# Patient Record
Sex: Male | Born: 1955 | Hispanic: No | Marital: Married | Smoking: Never smoker
Health system: Southern US, Community
[De-identification: ages and names within clinical notes are randomized; demographics above are authoritative.]

## PROBLEM LIST (undated history)

## (undated) DIAGNOSIS — I10 Essential (primary) hypertension: Secondary | ICD-10-CM

## (undated) HISTORY — PX: OTHER SURGICAL HISTORY: SHX169
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
## 2016-10-09 ENCOUNTER — Encounter (HOSPITAL_COMMUNITY): Payer: Self-pay | Admitting: Emergency Medicine

## 2016-10-09 ENCOUNTER — Emergency Department (HOSPITAL_COMMUNITY)
Admission: EM | Admit: 2016-10-09 | Discharge: 2016-10-09 | Disposition: A | Discharge status: Home / Self Care | Payer: BLUE CROSS/BLUE SHIELD | Attending: Emergency Medicine | Admitting: Emergency Medicine

## 2016-10-09 DIAGNOSIS — Y999 Unspecified external cause status: Secondary | ICD-10-CM | POA: Diagnosis not present

## 2016-10-09 DIAGNOSIS — Y939 Activity, unspecified: Secondary | ICD-10-CM | POA: Diagnosis not present

## 2016-10-09 DIAGNOSIS — S0101XA Laceration without foreign body of scalp, initial encounter: Secondary | ICD-10-CM | POA: Insufficient documentation

## 2016-10-09 DIAGNOSIS — W228XXA Striking against or struck by other objects, initial encounter: Secondary | ICD-10-CM | POA: Diagnosis not present

## 2016-10-09 DIAGNOSIS — Y929 Unspecified place or not applicable: Secondary | ICD-10-CM | POA: Diagnosis not present

## 2016-10-09 MED ORDER — LIDOCAINE-EPINEPHRINE-TETRACAINE (LET) SOLUTION
3.0000 mL | Freq: Once | NASAL | Status: DC
  Filled 2016-10-09: qty 3

## 2016-10-09 MED ORDER — LIDOCAINE-EPINEPHRINE (PF) 2 %-1:200000 IJ SOLN
INTRAMUSCULAR | Status: AC
  Administered 2016-10-09: 20 mL
  Filled 2016-10-09: qty 20

## 2016-10-09 MED ORDER — IBUPROFEN 800 MG PO TABS: 800.0000 mg | ORAL_TABLET | Freq: Three times a day (TID) | ORAL | 0 refills | Status: DC | PRN
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
## 2016-10-09 NOTE — Discharge Instructions (Signed)
Please follow up with your doctor in 5-7 days for staples removal.  Allow skin to dry for the first 12-16 hrs before washing.  Monitor wound daily for any sign of infection.  Take ibuprofen or tylenol at home as needed for pain.

## 2016-10-15 ENCOUNTER — Emergency Department (HOSPITAL_COMMUNITY)
Admission: EM | Admit: 2016-10-15 | Discharge: 2016-10-15 | Disposition: A | Discharge status: Home / Self Care | Payer: BLUE CROSS/BLUE SHIELD | Attending: Emergency Medicine | Admitting: Emergency Medicine

## 2016-10-15 ENCOUNTER — Encounter (HOSPITAL_COMMUNITY): Payer: Self-pay | Admitting: Emergency Medicine

## 2016-10-15 DIAGNOSIS — Z4802 Encounter for removal of sutures: Secondary | ICD-10-CM

## 2016-10-15 DIAGNOSIS — Z79899 Other long term (current) drug therapy: Secondary | ICD-10-CM | POA: Diagnosis not present

## 2016-10-15 MED ORDER — LIDOCAINE 2% (20 MG/ML) 5 ML SYRINGE
INTRAMUSCULAR | Status: AC
  Filled 2016-10-15: qty 15

## 2016-10-15 NOTE — ED Triage Notes (Signed)
Pt returning for staple removal from laceration on Friday. Pt has 3 staples on crown of head.

## 2016-10-15 NOTE — Discharge Instructions (Signed)
Keep wound clean with mild soap and water. Keep area covered from the sunlight in order to improve your scar formation. Ice and elevate for any pain and swelling relief. Alternate between Ibuprofen and Tylenol for additional pain relief. Follow up with your primary care doctor as needed for any future concerns regarding your wound. Monitor area for signs of infection to include, but not limited to: increasing pain, spreading redness, drainage/pus, worsening swelling, or fevers. Return to emergency department for emergent changing or worsening symptoms.

## 2016-10-15 NOTE — ED Provider Notes (Signed)
[facility]-EMERGENCY DEPT Provider Note   By signing my name below, I, Carlos Bradley, attest that this documentation has been prepared under the direction and in the presence of [address] [name], [employer]. Electronically Signed: Earmon Bradley, ED Scribe. 10/15/16. 12:10 PM.   History   Chief Complaint Chief Complaint  Patient presents with  . Suture / Staple Removal    The history is provided by the patient and medical records. No language interpreter was used.  Suture / Staple Removal  This is a new problem. The current episode started more than 2 days ago. The problem occurs rarely. The problem has been rapidly improving. Pertinent negatives include no chest pain, no abdominal pain and no shortness of breath. Nothing aggravates the symptoms. Nothing relieves the symptoms. Treatments tried: staples. The treatment provided significant relief.     Carlos Bradley is a 61 y.o. male who presents to the Emergency Department needing staples removed from the crown of his head that were placed six days ago. He has not taken anything for pain as he states he has not needed it. There are no modifying factors noted. He denies any issues and states the wound has healed well, and denies having pain, fever, chills, SOB, CP, abdominal pain, constipation, diarrhea, nausea, vomiting, dysuria, hematuria, body aches, redness, drainage, warmth or swelling of the area, numbness, tingling, focal weakness, or any other complaints at this time. Not on blood thinners.   History reviewed. No pertinent past medical history.  There are no active problems to display for this patient.   History reviewed. No pertinent surgical history.     Home Medications    Prior to Admission medications   Medication Sig Start Date End Date Taking? Authorizing Provider  ibuprofen (ADVIL,MOTRIN) 800 MG tablet Take 1 tablet (800 mg total) by mouth every 8 (eight) hours as needed for mild pain or moderate pain. 10/09/16   Fayrene Helper, PA-C    Family History No family history on file.  Social History Social History  Substance Use Topics  . Smoking status: Never Smoker  . Smokeless tobacco: Never Used  . Alcohol use Not on file     Allergies   Codeine   Review of Systems Review of Systems  Constitutional: Negative for chills and fever.  Respiratory: Negative for shortness of breath.   Cardiovascular: Negative for chest pain.  Gastrointestinal: Negative for abdominal pain, constipation, diarrhea, nausea and vomiting.  Genitourinary: Negative for dysuria and hematuria.  Musculoskeletal: Negative for arthralgias and myalgias.  Skin: Positive for wound. Negative for color change.  Allergic/Immunologic: Negative for immunocompromised state.  Neurological: Negative for weakness and numbness.  Hematological: Does not bruise/bleed easily.  Psychiatric/Behavioral: Negative for confusion.   All other systems reviewed and are negative for acute change except as noted in the HPI.   Physical Exam Updated Vital Signs BP 114/76   Pulse 63   Temp 98.1 F (36.7 C) (Oral)   Resp 16   Ht 5\' 11"  (1.803 m)   Wt 170 lb (77.1 kg)   SpO2 98%   BMI 23.71 kg/m   Physical Exam  Constitutional: He is oriented to person, place, and time. Vital signs are normal. He appears well-developed and well-nourished.  Non-toxic appearance. No distress.  Afebrile, nontoxic, NAD  HENT:  Head: Normocephalic.  Mouth/Throat: Mucous membranes are normal.  Eyes: Conjunctivae and EOM are normal. Right eye exhibits no discharge. Left eye exhibits no discharge.  Neck: Normal range of motion. Neck supple.  Cardiovascular: Normal rate and  intact distal pulses.   Pulmonary/Chest: Effort normal. No respiratory distress.  Abdominal: Normal appearance. He exhibits no distension.  Musculoskeletal: Normal range of motion.  Neurological: He is alert and oriented to person, place, and time. He has normal strength. No sensory deficit.    Skin: Skin is warm, dry and intact. No rash noted.  Well healed laceration to the crown of scalp with 3 staples in place, no erythema or warmth, no drainage, no swelling.  Psychiatric: He has a normal mood and affect.  Nursing note and vitals reviewed.    ED Treatments / Results  DIAGNOSTIC STUDIES: Oxygen Saturation is 98% on RA, normal by my interpretation.   COORDINATION OF CARE: 12:03 PM- Will remove sutures. Pt verbalizes understanding and agrees to plan.  Medications - No data to display  Labs (all labs ordered are listed, but only abnormal results are displayed) Labs Reviewed - No data to display  EKG  EKG Interpretation None       Radiology No results found.  Procedures .Suture Removal Date/Time: 10/15/2016 12:03 PM Performed by: Rhona RaiderSTREET, Kimyata Milich Authorized by: Rhona RaiderSTREET, Kelilah Hebard   Consent:    Consent obtained:  Verbal   Consent given by:  Patient   Risks discussed:  Pain   Alternatives discussed:  Referral Location:    Location:  Head/neck   Head/neck location:  Scalp Procedure details:    Wound appearance:  No signs of infection, good wound healing and clean   Number of staples removed:  3 Post-procedure details:    Post-removal:  No dressing applied   Patient tolerance of procedure:  Tolerated well, no immediate complications    (including critical care time)  Medications Ordered in ED Medications - No data to display   Initial Impression / Assessment and Plan / ED Course  I have reviewed the triage vital signs and the nursing notes.  Pertinent labs & imaging results that were available during my care of the patient were reviewed by me and considered in my medical decision making (see chart for details).     61 y.o. male here for staple removal. 3 staples removed from head, scar well healing and without any s/sx of infection or issues. Advised avoidance of sunlight to help with scarring. F/up with PCP as needed. I explained the diagnosis and  have given explicit precautions to return to the ER including for any other new or worsening symptoms. The patient understands and accepts the medical plan as it's been dictated and I have answered their questions. Discharge instructions concerning home care and prescriptions have been given. The patient is STABLE and is discharged to home in good condition.   I personally performed the services described in this documentation, which was scribed in my presence. The recorded information has been reviewed and is accurate.    Final Clinical Impressions(s) / ED Diagnoses   Final diagnoses:  Encounter for staple removal    New Prescriptions New Prescriptions   No medications on [address]       Kapono Luhn, ScottMercedes, [place] JerseyPA-C 10/15/16 1219    Charlynne PanderYao, David Hsienta, MD 10/15/16 [id number]

## 2017-07-25 ENCOUNTER — Encounter (HOSPITAL_COMMUNITY): Payer: Self-pay | Admitting: Emergency Medicine

## 2017-07-25 ENCOUNTER — Emergency Department (HOSPITAL_COMMUNITY)
Admission: EM | Admit: 2017-07-25 | Discharge: 2017-07-25 | Disposition: A | Discharge status: Home / Self Care | Payer: BLUE CROSS/BLUE SHIELD | Attending: Emergency Medicine | Admitting: Emergency Medicine

## 2017-07-25 ENCOUNTER — Emergency Department (HOSPITAL_COMMUNITY): Payer: BLUE CROSS/BLUE SHIELD

## 2017-07-25 DIAGNOSIS — R739 Hyperglycemia, unspecified: Secondary | ICD-10-CM | POA: Diagnosis not present

## 2017-07-25 DIAGNOSIS — I10 Essential (primary) hypertension: Secondary | ICD-10-CM | POA: Insufficient documentation

## 2017-07-25 DIAGNOSIS — R319 Hematuria, unspecified: Secondary | ICD-10-CM | POA: Insufficient documentation

## 2017-07-25 DIAGNOSIS — E860 Dehydration: Secondary | ICD-10-CM | POA: Insufficient documentation

## 2017-07-25 DIAGNOSIS — N200 Calculus of kidney: Secondary | ICD-10-CM | POA: Diagnosis not present

## 2017-07-25 DIAGNOSIS — R1032 Left lower quadrant pain: Secondary | ICD-10-CM | POA: Insufficient documentation

## 2017-07-25 DIAGNOSIS — R05 Cough: Secondary | ICD-10-CM | POA: Diagnosis not present

## 2017-07-25 DIAGNOSIS — B349 Viral infection, unspecified: Secondary | ICD-10-CM

## 2017-07-25 DIAGNOSIS — Z79899 Other long term (current) drug therapy: Secondary | ICD-10-CM | POA: Insufficient documentation

## 2017-07-25 DIAGNOSIS — R112 Nausea with vomiting, unspecified: Secondary | ICD-10-CM | POA: Diagnosis present

## 2017-07-25 DIAGNOSIS — N400 Enlarged prostate without lower urinary tract symptoms: Secondary | ICD-10-CM | POA: Diagnosis not present

## 2017-07-25 HISTORY — DX: Essential (primary) hypertension: I10

## 2017-07-25 LAB — CBC WITH DIFFERENTIAL/PLATELET
BASOS PCT: 0 %
Basophils Absolute: 0 10*3/uL (ref 0.0–0.1)
EOS ABS: 0 10*3/uL (ref 0.0–0.7)
Eosinophils Relative: 0 %
HEMATOCRIT: 40 % (ref 39.0–52.0)
Hemoglobin: 13.6 g/dL (ref 13.0–17.0)
LYMPHS ABS: 0.6 10*3/uL — AB (ref 0.7–4.0)
Lymphocytes Relative: 8 %
MCH: 30.4 pg (ref 26.0–34.0)
MCHC: 34 g/dL (ref 30.0–36.0)
MCV: 89.5 fL (ref 78.0–100.0)
MONOS PCT: 2 %
Monocytes Absolute: 0.2 10*3/uL (ref 0.1–1.0)
NEUTROS ABS: 7 10*3/uL (ref 1.7–7.7)
NEUTROS PCT: 90 %
Platelets: 245 10*3/uL (ref 150–400)
RBC: 4.47 MIL/uL (ref 4.22–5.81)
RDW: 12.6 % (ref 11.5–15.5)
WBC: 7.8 10*3/uL (ref 4.0–10.5)

## 2017-07-25 LAB — URINALYSIS, ROUTINE W REFLEX MICROSCOPIC
BILIRUBIN URINE: NEGATIVE
Glucose, UA: NEGATIVE mg/dL
KETONES UR: NEGATIVE mg/dL
Nitrite: NEGATIVE
PH: 5 (ref 5.0–8.0)
Protein, ur: NEGATIVE mg/dL
SPECIFIC GRAVITY, URINE: 1.013 (ref 1.005–1.030)
SQUAMOUS EPITHELIAL / LPF: NONE SEEN

## 2017-07-25 LAB — COMPREHENSIVE METABOLIC PANEL
ALBUMIN: 4.4 g/dL (ref 3.5–5.0)
ALT: 32 U/L (ref 17–63)
AST: 32 U/L (ref 15–41)
Alkaline Phosphatase: 56 U/L (ref 38–126)
Anion gap: 15 (ref 5–15)
BILIRUBIN TOTAL: 1 mg/dL (ref 0.3–1.2)
BUN: 26 mg/dL — AB (ref 6–20)
CO2: 25 mmol/L (ref 22–32)
CREATININE: 1.74 mg/dL — AB (ref 0.61–1.24)
Calcium: 9.8 mg/dL (ref 8.9–10.3)
Chloride: 102 mmol/L (ref 101–111)
GFR calc Af Amer: 47 mL/min — ABNORMAL LOW (ref >60)
GFR calc non Af Amer: 41 mL/min — ABNORMAL LOW (ref >60)
GLUCOSE: 148 mg/dL — AB (ref 65–99)
Potassium: 4 mmol/L (ref 3.5–5.1)
SODIUM: 142 mmol/L (ref 135–145)
TOTAL PROTEIN: 7.5 g/dL (ref 6.5–8.1)

## 2017-07-25 LAB — LIPASE, BLOOD: Lipase: 37 U/L (ref 11–51)

## 2017-07-25 MED ORDER — ONDANSETRON HCL 4 MG/2ML IJ SOLN
4.0000 mg | Freq: Once | INTRAMUSCULAR | Status: AC
  Administered 2017-07-25: 4 mg via INTRAVENOUS
  Filled 2017-07-25: qty 2

## 2017-07-25 MED ORDER — ONDANSETRON 4 MG PO TBDP: ORAL_TABLET | ORAL | 0 refills | Status: AC

## 2017-07-25 MED ORDER — SODIUM CHLORIDE 0.9 % IV BOLUS (SEPSIS)
1000.0000 mL | Freq: Once | INTRAVENOUS | Status: AC
  Administered 2017-07-25: 1000 mL via INTRAVENOUS

## 2017-07-25 NOTE — ED Triage Notes (Addendum)
Pt reports cough, nausea, vomiting 1 week.ago.  Sx subsided , then resumed NVD last night.  Last vomited clear liquid this am. Last meal of sandwich and soup, last night. Took Tylenol 100mg  last night muscle aches and Robitussin DM for cough. Pt is alert , oriented and ambulatory. Wife at bedside..  Pt denies shortness of breath, denies sore throat

## 2017-07-25 NOTE — ED Provider Notes (Signed)
[facility]-EMERGENCY DEPT Provider Note   CSN: [id number] Arrival date & time: 07/25/17  4782     History   Chief Complaint Chief Complaint  Patient presents with  . Nausea  . Emesis  . Cough  . Flank Pain    HPI Tahmir Kleckner is a 62 y.o. male.  Leemon Ayala is a 62 y.o. Male with a history of hypertension, presents to the ED for evaluation of cough, generalized body aches, nausea and vomiting.  Patient reports initially symptoms started 1 week ago when patient began coughing had some mild nausea and vomiting, rhinorrhea and low-grade fevers, the symptoms seem to be improving and have almost subsided by Thursday, but last night the patient began to experience nausea and vomiting once again, as well as some intermittent productive cough, patient reports subjective fevers and chills and generalized body aches.  Patient also reports pain particularly over the left flank which has been intermittent since last night.  Patient denies any focal abdominal pain, no hematemesis, diarrhea, melena or hematochezia.  Patient has been unable to keep down any food or fluids since last night and is now feeling generally weak.  He denies any dysuria, frequency or hematuria, no history of prior kidney stones.  Patient denies chest pain or shortness of breath.  No one at home with similar symptoms although patient did travel on airplanes this week.      Past Medical History:  Diagnosis Date  . Hypertension    tx due to  family hx    There are no active problems to display for this patient.   Past Surgical History:  Procedure Laterality Date  . wisdom teeth         Home Medications    Prior to Admission medications   Medication Sig Start Date End Date Taking? Authorizing Provider  acetaminophen (TYLENOL) 500 MG tablet Take 1,000 mg by mouth every 6 (six) hours as needed for headache.   Yes [provider]  atorvastatin (LIPITOR) 10 MG tablet Take 10  mg by mouth daily. 05/14/17  Yes [provider]  glucosamine-chondroitin 500-400 MG tablet Take 2 tablets by mouth daily.   Yes [provider]  hydrochlorothiazide (MICROZIDE) 12.5 MG capsule Take 12.5 mg by mouth daily. 07/22/17  Yes [provider]  ibuprofen (ADVIL,MOTRIN) 200 MG tablet Take 200 mg by mouth every 6 (six) hours as needed for moderate pain.   Yes [provider]  Misc Natural Products (BETA-SITOSTEROL PLANT STEROLS PO) Take 1 tablet by mouth 2 (two) times daily.   Yes [provider]  Multiple Vitamin (MULTIVITAMIN WITH MINERALS) TABS tablet Take 1 tablet by mouth daily.   Yes [provider]  naproxen sodium (ALEVE) 220 MG tablet Take 220 mg by mouth daily as needed (pain).   Yes [provider]  Omega-3 Fatty Acids (FISH OIL PO) Take 2 tablets by mouth daily.   Yes [provider]  Probiotic Product (ALIGN PO) Take 1 tablet by mouth daily.   Yes [provider]  Propylene Glycol (SYSTANE COMPLETE OP) Apply 2 drops to eye 2 (two) times daily.   Yes [provider]  vitamin C (ASCORBIC ACID) 500 MG tablet Take 500 mg by mouth daily.   Yes [provider]  ibuprofen (ADVIL,MOTRIN) 800 MG tablet Take 1 tablet (800 mg total) by mouth every 8 (eight) hours as needed for mild pain or moderate pain. Patient not taking: Reported on 07/25/2017 10/09/16   Fayrene Helper,  PA-C  ondansetron (ZOFRAN ODT) 4 MG disintegrating tablet 4mg  ODT q4 hours prn nausea/vomit 07/25/17   Dartha Lodge, PA-C    Family History Family History  Problem Relation Age of Onset  . Hypertension Mother   . Hypertension Father     Social History Social History   Tobacco Use  . Smoking status: Never Smoker  . Smokeless tobacco: Never Used  Substance Use Topics  . Alcohol use: No    Frequency: Never  . Drug use: Not on file     Allergies   Codeine; Tuna [fish allergy]; and Other   Review of  Systems Review of Systems  Constitutional: Positive for chills and fever.  HENT: Positive for congestion, rhinorrhea and sore throat.   Eyes: Negative for discharge, redness and itching.  Respiratory: Positive for cough. Negative for chest tightness, shortness of breath and wheezing.   Gastrointestinal: Positive for nausea and vomiting. Negative for abdominal pain, blood in stool and diarrhea.  Genitourinary: Positive for flank pain. Negative for dysuria and frequency.  Musculoskeletal: Positive for myalgias. Negative for arthralgias and back pain.  Skin: Negative for color change, pallor and rash.  Neurological: Positive for light-headedness. Negative for dizziness, weakness and headaches.     Physical Exam Updated Vital Signs BP (!) 155/83   Pulse 73   Temp 98.9 F (37.2 C) (Oral)   Resp 18   Wt 78 kg (172 lb)   SpO2 99%   BMI 23.99 kg/m   Physical Exam  Constitutional: He is oriented to person, place, and time. He appears well-developed and well-nourished. No distress.  Pt appears mildly ill, but is in no acute distress  HENT:  Head: Normocephalic and atraumatic.  TMs clear with good landmarks, minimal nasal mucosa edema with clear rhinorrhea, posterior oropharynx clear and moist, with some erythema, no edema or exudates, uvula midline   Eyes: Right eye exhibits no discharge. Left eye exhibits no discharge.  Cardiovascular: Normal rate, regular rhythm and normal heart sounds.  Pulmonary/Chest: Effort normal. No respiratory distress.  Respirations equal and unlabored, patient able to speak in full sentences, lungs clear to auscultation bilaterally  Abdominal: Soft. Bowel sounds are normal. He exhibits no distension and no mass. There is no tenderness. There is no guarding.  Abdomen soft, NTTP in all quadrants, no CVA tenderness.   Musculoskeletal: He exhibits no edema or deformity.  Neurological: He is alert and oriented to person, place, and time. Coordination normal.   Skin: Skin is warm and dry. Capillary refill takes less than 2 seconds. He is not diaphoretic.  Psychiatric: He has a normal mood and affect. His behavior is normal.  Nursing note and vitals reviewed.    ED Treatments / Results  Labs (all labs ordered are listed, but only abnormal results are displayed) Labs Reviewed  URINALYSIS, ROUTINE W REFLEX MICROSCOPIC - Abnormal; Notable for the following components:      Result Value   Hgb urine dipstick MODERATE (*)    Leukocytes, UA TRACE (*)    Bacteria, UA RARE (*)    All other components within normal limits  CBC WITH DIFFERENTIAL/PLATELET - Abnormal; Notable for the following components:   Lymphs Abs 0.6 (*)    All other components within normal limits  COMPREHENSIVE METABOLIC PANEL - Abnormal; Notable for the following components:   Glucose, Bld 148 (*)    BUN 26 (*)    Creatinine, Ser 1.74 (*)    GFR calc non Af Amer 41 (*)  GFR calc Af Amer 47 (*)    All other components within normal limits  LIPASE, BLOOD    EKG  EKG Interpretation None       Radiology Dg Chest 2 View  Result Date: 07/25/2017 CLINICAL DATA:  Pt c/o slightly productive cough, intermittent fever x 1 wk. EXAM: CHEST  2 VIEW COMPARISON:  None. FINDINGS: The heart size and mediastinal contours are within normal limits. Both lungs are clear. No pleural effusion or pneumothorax. The visualized skeletal structures are unremarkable. IMPRESSION: No active cardiopulmonary disease. Electronically Signed   By: Amie Portland M.D.   On: 07/25/2017 12:30   Ct Renal Stone Study  Result Date: 07/25/2017 CLINICAL DATA:  Pt reports cough, nausea, vomiting 1 week.ago. Sx subsided , then resumed NVD last night. Last vomited clear liquid this am. Last meal of sandwich and soup, last nightHematuria, no hx of stones EXAM: CT ABDOMEN AND PELVIS WITHOUT CONTRAST TECHNIQUE: Multidetector CT imaging of the abdomen and pelvis was performed following the standard protocol without IV  contrast. COMPARISON:  None. FINDINGS: Lower chest: Mild reticular opacities in the dependent lower lobes consistent with subsegmental atelectasis. Lung bases otherwise clear. Heart normal in size. Hepatobiliary: 7 mm low-density lesion in segment 7 consistent with a cyst. No other liver masses or lesions. Normal gallbladder. No bile duct dilation. Pancreas: Unremarkable. No pancreatic ductal dilatation or surrounding inflammatory changes. Spleen: Normal in size without focal abnormality. Adrenals/Urinary Tract: No adrenal masses. Mild bilateral hydronephrosis and hydroureter is. There are 3 small nonobstructing stones in the lower pole the left kidney. No other intrarenal stones. No renal masses. There are no ureteral stones. The bladder is distended. No bladder wall thickening. No bladder stones. Bladder is mildly elevated from an enlarged prostate. Stomach/Bowel: Stomach and small bowel unremarkable. There is increased stool in mild distention within the transverse and right colon. No colonic wall thickening or adjacent inflammation. Right colon extends across the midline to have the cecum lie in the left mid to upper abdomen. Normal appendix visualized. Vascular/Lymphatic: No significant vascular findings are present. No enlarged abdominal or pelvic lymph nodes. Reproductive: Prostate is enlarged measuring 6 x 5.2 x 5.2 cm. It bulges into the bladder base. Other: No abdominal wall hernia or abnormality. No abdominopelvic ascites. Musculoskeletal: No acute or significant osseous findings. IMPRESSION: 1. Mild bilateral hydronephrosis and hydroureters, without a ureteral stone. This appears due to bladder distention which is likely on the basis of bladder outlet obstruction due to an enlarged prostate. 2. Three small stones in the lower pole the left kidney. 3. Mild distention with increased stool noted in the right colon. No bowel inflammation or obstruction. Electronically Signed   By: Amie Portland M.D.   On:  07/25/2017 15:37    Procedures Procedures (including critical care time)  Medications Ordered in ED Medications  sodium chloride 0.9 % bolus 1,000 mL (0 mLs Intravenous Stopped 07/25/17 1337)  ondansetron (ZOFRAN) injection 4 mg (4 mg Intravenous Given 07/25/17 1211)  sodium chloride 0.9 % bolus 1,000 mL (0 mLs Intravenous Stopped 07/25/17 1605)     Initial Impression / Assessment and Plan / ED Course  I have reviewed the triage vital signs and the nursing notes.  Pertinent labs & imaging results that were available during my care of the patient were reviewed by me and considered in my medical decision making (see chart for details).  Patient presents for evaluation of nausea, vomiting, generalized aches and coughing, initial symptoms 1 week ago started to resolve  worsened last night unable to keep down any fluids.  Patient does report some left flank pain starting last night.  On arrival all vitals normal patient appears to be in no acute distress, does appear dehydrated on exam.  Lungs clear, abdomen benign.  Labs and chest x-ray and give patient 2 L fluid bolus and Zofran.  Chest x-ray without evidence of pneumonia or other acute cardiopulmonary disease.  Labs overall reassuring, no leukocytosis, normal hemoglobin, creatinine is 1.74 on review of medical record was 1.3 about a year ago, I feel this is likely prerenal and due to dehydration, BUN elevated.  Glucose is 148, patient was not fasting prior to arrival will have this rechecked with his primary care doctor.  No other acute electrolyte derangements, normal liver function.  Normal lipase.  UA without signs of infection, does show moderate amount of RBCs, given that patient has had some flank pain will get CT renal stone study to rule out although I have a low suspicion patient appears comfortable here in the emergency department.  On reevaluation at fluids patient reports he is feeling better, no further episodes of emesis since Zofran  and patient tolerating p.o.  CT scan shows mild bilateral hydronephrosis without obstructing kidney stones, felt that this is likely due to enlarged prostate, although patient has been urinating freely I do not feel that patient has acute urinary retention.  It does show 3 small stones in the left renal pole.  Will have patient follow-up with urology regarding enlarged prostate strict return precautions regarding urinary retention provided.  Will also have patient follow-up with his primary care provider for recheck of kidney function as well as glucose.  Encourage patient to drink plenty of fluids will discharge with Zofran suspect that most of patient's symptoms are due to viral etiology he has been traveling and has likely been exposed to multiple viruses in the past week.  At this time I feel that the patient is stable for discharge home, return precautions discussed.  Patient expresses understanding and is in agreement with plan.  Patient discussed with Dr. Adriana Simasook who saw and evaluated patient as well and is in agreement with.  Final Clinical Impressions(s) / ED Diagnoses   Final diagnoses:  Viral illness  Dehydration  Non-intractable vomiting with nausea, unspecified vomiting type  Enlarged prostate  Kidney stone on left side  Hyperglycemia    ED Discharge Orders        Ordered    ondansetron (ZOFRAN ODT) 4 MG disintegrating tablet     07/25/17 1547       Legrand RamsFord, Ikram Riebe N, PA-C 07/25/17 2049    Donnetta Hutchingook, Brian, MD 07/28/17 [id number]

## 2017-07-25 NOTE — Discharge Instructions (Signed)
Your evaluation today has been overall reassuring likely related to a viral illness.  Chest x-ray showed no evidence of pneumonia or overall reassuring.  Your kidney function was elevated today at 1.78, this was likely due to dehydration, continue to drink plenty of fluids and have this level rechecked within the next week by her primary care provider.  Your glucose was also slightly elevated today, I would like you to have this rechecked as well. You may use zofran as needed for nausea, tylenol for pain or fevers, please avoid NSAIDs until kidney function is rechecked.  CT scan shows an enlarged prostate which is causing some backup of urine towards your kidneys, it also shows 3 small nonobstructing stones in the left kidney.  You will need to follow-up with urology regarding prostate enlargement.  If any of the below scenarios develop please return to the ED for sooner reevaluation. Get help right away if: You have a fever or chills. You suddenly cannot urinate. You feel lightheaded, or very dizzy, or you faint. There are large amounts of blood or clots in the urine. Your urinary problems become hard to manage. You develop moderate to severe low back or flank pain. The flank is the side of your body between the ribs and the hip.

## 2017-07-27 ENCOUNTER — Encounter (HOSPITAL_COMMUNITY): Payer: Self-pay | Admitting: Emergency Medicine

## 2017-07-27 ENCOUNTER — Emergency Department (HOSPITAL_COMMUNITY)
Admission: EM | Admit: 2017-07-27 | Discharge: 2017-07-27 | Disposition: A | Discharge status: Home / Self Care | Payer: BLUE CROSS/BLUE SHIELD | Attending: Emergency Medicine | Admitting: Emergency Medicine

## 2017-07-27 ENCOUNTER — Emergency Department (HOSPITAL_COMMUNITY): Payer: BLUE CROSS/BLUE SHIELD

## 2017-07-27 DIAGNOSIS — R3914 Feeling of incomplete bladder emptying: Secondary | ICD-10-CM | POA: Diagnosis not present

## 2017-07-27 DIAGNOSIS — I10 Essential (primary) hypertension: Secondary | ICD-10-CM | POA: Insufficient documentation

## 2017-07-27 DIAGNOSIS — N401 Enlarged prostate with lower urinary tract symptoms: Secondary | ICD-10-CM | POA: Diagnosis not present

## 2017-07-27 DIAGNOSIS — R1032 Left lower quadrant pain: Secondary | ICD-10-CM | POA: Diagnosis present

## 2017-07-27 DIAGNOSIS — Z79899 Other long term (current) drug therapy: Secondary | ICD-10-CM | POA: Insufficient documentation

## 2017-07-27 DIAGNOSIS — R338 Other retention of urine: Secondary | ICD-10-CM | POA: Insufficient documentation

## 2017-07-27 DIAGNOSIS — N179 Acute kidney failure, unspecified: Secondary | ICD-10-CM | POA: Insufficient documentation

## 2017-07-27 LAB — SODIUM, URINE, RANDOM: Sodium, Ur: 35 mmol/L

## 2017-07-27 LAB — URINALYSIS, ROUTINE W REFLEX MICROSCOPIC
BILIRUBIN URINE: NEGATIVE
Glucose, UA: NEGATIVE mg/dL
HGB URINE DIPSTICK: NEGATIVE
Ketones, ur: NEGATIVE mg/dL
Leukocytes, UA: NEGATIVE
Nitrite: NEGATIVE
PH: 6 (ref 5.0–8.0)
Protein, ur: NEGATIVE mg/dL
SPECIFIC GRAVITY, URINE: 1.01 (ref 1.005–1.030)

## 2017-07-27 LAB — CBC
HCT: 36.2 % — ABNORMAL LOW (ref 39.0–52.0)
HEMOGLOBIN: 12.5 g/dL — AB (ref 13.0–17.0)
MCH: 30.3 pg (ref 26.0–34.0)
MCHC: 34.5 g/dL (ref 30.0–36.0)
MCV: 87.7 fL (ref 78.0–100.0)
Platelets: 236 10*3/uL (ref 150–400)
RBC: 4.13 MIL/uL — ABNORMAL LOW (ref 4.22–5.81)
RDW: 12.2 % (ref 11.5–15.5)
WBC: 14.4 10*3/uL — ABNORMAL HIGH (ref 4.0–10.5)

## 2017-07-27 LAB — COMPREHENSIVE METABOLIC PANEL
ALT: 43 U/L (ref 17–63)
ANION GAP: 14 (ref 5–15)
AST: 38 U/L (ref 15–41)
Albumin: 3.8 g/dL (ref 3.5–5.0)
Alkaline Phosphatase: 48 U/L (ref 38–126)
BILIRUBIN TOTAL: 1.2 mg/dL (ref 0.3–1.2)
BUN: 24 mg/dL — ABNORMAL HIGH (ref 6–20)
CALCIUM: 9.2 mg/dL (ref 8.9–10.3)
CO2: 26 mmol/L (ref 22–32)
Chloride: 94 mmol/L — ABNORMAL LOW (ref 101–111)
Creatinine, Ser: 2.34 mg/dL — ABNORMAL HIGH (ref 0.61–1.24)
GFR calc non Af Amer: 29 mL/min — ABNORMAL LOW (ref >60)
GFR, EST AFRICAN AMERICAN: 33 mL/min — AB (ref >60)
Glucose, Bld: 133 mg/dL — ABNORMAL HIGH (ref 65–99)
Potassium: 4 mmol/L (ref 3.5–5.1)
SODIUM: 134 mmol/L — AB (ref 135–145)
Total Protein: 6.8 g/dL (ref 6.5–8.1)

## 2017-07-27 LAB — CREATININE, URINE, RANDOM: CREATININE, URINE: 89.76 mg/dL

## 2017-07-27 MED ORDER — TAMSULOSIN HCL 0.4 MG PO CAPS: 0.4000 mg | ORAL_CAPSULE | Freq: Every day | ORAL | 0 refills | Status: DC

## 2017-07-27 MED ORDER — SODIUM CHLORIDE 0.9 % IV BOLUS (SEPSIS)
500.0000 mL | Freq: Once | INTRAVENOUS | Status: AC
  Administered 2017-07-27: 500 mL via INTRAVENOUS

## 2017-07-27 NOTE — ED Provider Notes (Addendum)
[facility]-EMERGENCY DEPT Provider Note   CSN: [id number] Arrival date & time: 07/27/17  0802     History   Chief Complaint Chief Complaint  Patient presents with  . Abdominal Pain  . Flank Pain    HPI Carlos Bradley is a 62 y.o. male.  HPI 62 year old comes in with chief complaint of left lower quadrant abdominal pain and left-sided flank pain.  Patient has history of hypertension, he was recently seen in the ED where he was noted to have prominent prostate, nephrolithiasis.  Patient does not have any known prostate issues and is not established with a urologist.  Patient states that since discharge his pain is moved towards the left lower quadrant and is more constant with waxing and waning intensity.  Patient denies any dysuria, but he does have urinary urgency, and also some dribbling. No known history of diverticulosis and patient denies any diarrhea, fevers, emesis, rectal pain.  Review of system is positive for nausea.  Past Medical History:  Diagnosis Date  . Hypertension    tx due to  family hx    There are no active problems to display for this patient.   Past Surgical History:  Procedure Laterality Date  . wisdom teeth         Home Medications    Prior to Admission medications   Medication Sig Start Date End Date Taking? Authorizing Provider  acetaminophen (TYLENOL) 500 MG tablet Take 1,000 mg by mouth every 6 (six) hours as needed for headache.   Yes [provider]  atorvastatin (LIPITOR) 10 MG tablet Take 10 mg by mouth daily. 05/14/17  Yes [provider]  glucosamine-chondroitin 500-400 MG tablet Take 2 tablets by mouth daily.   Yes [provider]  hydrochlorothiazide (MICROZIDE) 12.5 MG capsule Take 12.5 mg by mouth daily. 07/22/17  Yes [provider]  ibuprofen (ADVIL,MOTRIN) 200 MG tablet Take 200 mg by mouth every 6 (six) hours as needed for moderate pain.   Yes [provider]    Misc Natural Products (BETA-SITOSTEROL PLANT STEROLS PO) Take 1 tablet by mouth 2 (two) times daily.   Yes [provider]  Multiple Vitamin (MULTIVITAMIN WITH MINERALS) TABS tablet Take 1 tablet by mouth daily.   Yes [provider]  naproxen sodium (ALEVE) 220 MG tablet Take 220 mg by mouth daily as needed (pain).   Yes [provider]  Omega-3 Fatty Acids (FISH OIL PO) Take 2 tablets by mouth daily.   Yes [provider]  ondansetron (ZOFRAN ODT) 4 MG disintegrating tablet 4mg  ODT q4 hours prn nausea/vomit Patient taking differently: Take 4 mg by mouth every 4 (four) hours as needed for nausea or vomiting.  07/25/17  Yes Dartha Lodge, PA-C  Probiotic Product (ALIGN PO) Take 1 tablet by mouth daily.   Yes [provider]  Propylene Glycol (SYSTANE COMPLETE OP) Apply 2 drops to eye 2 (two) times daily.   Yes [provider]  vitamin C (ASCORBIC ACID) 500 MG tablet Take 500 mg by mouth daily.   Yes [provider]  ibuprofen (ADVIL,MOTRIN) 800 MG tablet Take 1 tablet (800 mg total) by mouth every 8 (eight) hours as needed for mild pain or moderate pain. Patient not taking: Reported on 07/25/2017 10/09/16   Fayrene Helper, PA-C  tamsulosin (FLOMAX) 0.4 MG CAPS capsule Take 1 capsule (0.4 mg total) by mouth daily. 07/27/17   Derwood Kaplan, MD    Family History Family History  Problem Relation  Age of Onset  . Hypertension Mother   . Hypertension Father     Social History Social History   Tobacco Use  . Smoking status: Never Smoker  . Smokeless tobacco: Never Used  Substance Use Topics  . Alcohol use: No    Frequency: Never  . Drug use: Not on file     Allergies   Azithromycin; Codeine; Other; and [place] allergy]   Review of Systems Review of Systems  Constitutional: Positive for activity change.  Gastrointestinal: Positive for abdominal pain.  Allergic/Immunologic: Negative for immunocompromised state.   Hematological: Does not bruise/bleed easily.  All other systems reviewed and are negative.    Physical Exam Updated Vital Signs BP 140/78 (BP Location: Right Arm)   Pulse 64   Temp 98 F (36.7 C) (Oral)   Resp 18   Ht 5\' 11"  (1.803 m)   Wt 78 kg (172 lb)   SpO2 97%   BMI 23.99 kg/m   Physical Exam  Constitutional: He is oriented to person, place, and time. He appears well-developed.  HENT:  Head: Normocephalic and atraumatic.  Eyes: Conjunctivae and EOM are normal. Pupils are equal, round, and reactive to light.  Neck: Normal range of motion. Neck supple.  Cardiovascular: Normal rate and regular rhythm.  Pulmonary/Chest: Effort normal and breath sounds normal.  Abdominal: Soft. Bowel sounds are normal. He exhibits no distension. There is tenderness in the left lower quadrant. There is guarding. There is no rebound.  Musculoskeletal: He exhibits no deformity.  Neurological: He is alert and oriented to person, place, and time.  Skin: Skin is warm.  Nursing note and vitals reviewed.    ED Treatments / Results  Labs (all labs ordered are listed, but only abnormal results are displayed) Labs Reviewed  COMPREHENSIVE METABOLIC PANEL - Abnormal; Notable for the following components:      Result Value   Sodium 134 (*)    Chloride 94 (*)    Glucose, Bld 133 (*)    BUN 24 (*)    Creatinine, Ser 2.34 (*)    GFR calc non Af Amer 29 (*)    GFR calc Af Amer 33 (*)    All other components within normal limits  CBC - Abnormal; Notable for the following components:   WBC 14.4 (*)    RBC 4.13 (*)    Hemoglobin 12.5 (*)    HCT 36.2 (*)    All other components within normal limits  URINALYSIS, ROUTINE W REFLEX MICROSCOPIC  SODIUM, URINE, RANDOM  CREATININE, URINE, RANDOM    EKG  EKG Interpretation None       Radiology US Renal  Result Date: 07/27/2017 CLINICAL DATA:  Renal failure EXAM: RENAL / URINARY TRACT ULTRASOUND COMPLETE COMPARISON:  CT abdomen and pelvis  July 25, 2017 FINDINGS: Right Kidney: Length: 11.3 cm. Echogenicity is increased. Renal cortical thickness is normal. No mass perinephric fluid visualized. There is mild hydronephrosis seen after patient voids. There is proximal ureterectasis. No calculus evident. Left Kidney: Length: 11.9 cm. Echogenicity is increased. Renal cortical thickness is normal. No mass visualized. There is moderate perinephric fluid on the left with slight fullness of the left renal collecting system. No sonographically demonstrable calculus or ureterectasis. Bladder: Urinary bladder remains distended after patient attempts to void. There is wall thickening in portions of the urinary bladder. Postvoid residual is measured at 561 cubic cm. Prostate is prominent measuring 4.3 x 3.3 x 3.1 cm. Prostate impresses upon the inferior urinary bladder. IMPRESSION: 1. Large  postvoid residual suggesting bladder outlet obstruction. Mild wall thickening in the urinary bladder suggests a degree of cystitis. 2. Mild hydronephrosis on the right with minimal hydronephrosis on the left. Suspect stasis phenomenon due to persistence of urinary bladder distention. No calculi evident ultrasound. 3. Moderate perinephric fluid on the left is noted. Question calyceal rupture due to chronic hydronephrosis on the left causing this perinephric fluid. 4. Increase in renal echogenicity bilaterally, a finding felt to be indicative of chronic medical renal disease. Renal cortical thickness bilaterally normal. 5. Prominent prostate which impresses upon the inferior aspect of the urinary bladder. Electronically Signed   By: Bretta BangWilliam  Woodruff III M.D.   On: 07/27/2017 12:31    Procedures Procedures (including critical care time)  Medications Ordered in ED Medications  sodium chloride 0.9 % bolus 500 mL (500 mLs Intravenous New Bag/Given 07/27/17 1540)     Initial Impression / Assessment and Plan / ED Course  I have reviewed the triage vital signs and the  nursing notes.  Pertinent labs & imaging results that were available during my care of the patient were reviewed by me and considered in my medical decision making (see chart for details).  Clinical Course as of Jul 27 1544  Tue Jul 27, 2017  1516 Dr. Berneice HeinrichManny, Urology recommends f.u in 1 week and starting tamsulosin. 500 cc bolus ordered, FENA < 1. Creatinine: (!) 2.34 [AN]  1516 Results from the ER workup discussed with the patient face to face and all questions answered to the best of my ability.   [AN]    Clinical Course User Index [AN] Derwood KaplanNanavati, Angelgabriel Willmore, MD   [age] year old with chief complaint of left lower quadrant abdominal pain and nausea.  Patient was recently diagnosed with bilateral hydronephrosis, and pronounced prostate.  Patient has not seen urologist.  He returns with worsening of his symptoms.  Patient does not have distended abdomen on the exam, and he has focal left lower quadrant tenderness.  I spoke with the radiologist, and they did not see any evidence of diverticulosis on the CT renal stone that was completed just 2 days ago.  In addition to diverticulitis, differential diagnosis includes ureteral colic, and acute urinary retention.  Patient's creatinine is elevated.  Bladder scan revealed more than 500 cc of urine, and patient's ultrasound renal is showing persistent bilateral hydronephrosis.  It appears clinically that foot patient is having postobstructive uropathy, secondary to BPH.  Foley catheter placed and we had more than thousand cc of urine output.  Patient feels a lot comfortable on reassessment.   We will call urology and discussed the disposition.  Final Clinical Impressions(s) / ED Diagnoses   Final diagnoses:  AKI (acute kidney injury) (HCC)  Acute urinary retention  Benign prostatic hyperplasia with incomplete bladder emptying    ED Discharge Orders        Ordered    tamsulosin (FLOMAX) 0.4 MG CAPS capsule  Daily     07/27/17 1533       Derwood KaplanNanavati,  Nichele Slawson, MD 07/27/17 1455    Derwood KaplanNanavati, Neiva Maenza, MD 07/27/17 1517    Derwood KaplanNanavati, Ottie Tillery, MD 07/27/17 1546

## 2017-07-27 NOTE — Discharge Instructions (Addendum)
He was seen in the ER for abdominal pain, and we noted that you have significant urinary retention. We suspect that the underlying cause for your pain is urinary retention and sending you home with a Foley catheter. Please read the instructions provided on the Foley catheter.  We spoke with the urologist and they would like you to be seen in 1 week.  Ensure that you drink plenty of fluid and start taking the medications prescribed.

## 2017-07-27 NOTE — ED Triage Notes (Signed)
Patient here from home with complaints of right side abdominal pain radiating around to back. Reports being here on 2/24 and diagnosed with 3 stones. Increased pain last night, mild today. Denies n/v/d.

## 2017-08-30 ENCOUNTER — Other Ambulatory Visit: Payer: Self-pay | Admitting: Urology

## 2017-08-31 ENCOUNTER — Other Ambulatory Visit: Payer: Self-pay | Admitting: Urology

## 2017-09-01 MED ORDER — MAGNESIUM CITRATE PO SOLN: 1.0000 | Freq: Once | ORAL | Status: AC

## 2017-09-03 NOTE — Patient Instructions (Addendum)
Carlos DoveSteven Bradley  09/03/2017   Your procedure is scheduled on: 09-08-17   Report [facility]  Entrance Report to Admitting at 10:00 AM    Call this number if you have problems the morning of surgery [phone]   Remember: Do not eat food or drink liquids :After Midnight.     Take these medicines the morning of surgery with A SIP OF WATER: None. You may bring and use your eyedrops as needed.                               You may not have any metal on your body including hair pins and              piercings  Do not wear jewelry, lotions, powders or deodorant             Men may shave face and neck.   Do not bring valuables to the hospital. [place] IS NOT             RESPONSIBLE   FOR VALUABLES.  Contacts, dentures or bridgework may not be worn into surgery.  Leave suitcase in the car. After surgery it may be brought to your room.                Please read over the following fact sheets you were given: _____________________________________________________________________          [facility] Before surgery, you can play an important role.  Because skin is not sterile, your skin needs to be as free of germs as possible.  You can reduce the number of germs on your skin by washing with CHG (chlorahexidine gluconate) soap before surgery.  CHG is an antiseptic cleaner which kills germs and bonds with the skin to continue killing germs even after washing. Please DO NOT use if you have an allergy to CHG or antibacterial soaps.  If your skin becomes reddened/irritated stop using the CHG and inform your nurse when you arrive at Short Stay. Do not shave (including legs and underarms) for at least 48 hours prior to the first CHG shower.  You may shave your face/neck. Please follow these instructions carefully:  1.  Shower with CHG Soap the night before surgery and the  morning of Surgery.  2.  If you choose to wash your hair, wash your  hair first as usual with your  normal  shampoo.  3.  After you shampoo, rinse your hair and body thoroughly to remove the  shampoo.                           4.  Use CHG as you would any other liquid soap.  You can apply chg directly  to the skin and wash                       Gently with a scrungie or clean washcloth.  5.  Apply the CHG Soap to your body ONLY FROM THE NECK DOWN.   Do not use on face/ open                           Wound or open sores. Avoid contact with eyes, ears mouth and  genitals (private parts).                       Wash face,  Genitals (private parts) with your normal soap.             6.  Wash thoroughly, paying special attention to the area where your surgery  will be performed.  7.  Thoroughly rinse your body with warm water from the neck down.  8.  DO NOT shower/wash with your normal soap after using and rinsing off  the CHG Soap.                9.  Pat yourself dry with a clean towel.            10.  Wear clean pajamas.            11.  Place clean sheets on your bed the night of your first shower and do not  sleep with pets. Day of Surgery : Do not apply any lotions/deodorants the morning of surgery.  Please wear clean clothes to the hospital/surgery center.  FAILURE TO FOLLOW THESE INSTRUCTIONS MAY RESULT IN THE CANCELLATION OF YOUR SURGERY PATIENT SIGNATURE_________________________________  NURSE SIGNATURE__________________________________  ________________________________________________________________________  WHAT IS A BLOOD TRANSFUSION? Blood Transfusion Information  A transfusion is the replacement of blood or some of its parts. Blood is made up of multiple cells which provide different functions.  Red blood cells carry oxygen and are used for blood loss replacement.  White blood cells fight against infection.  Platelets control bleeding.  Plasma helps clot blood.  Other blood products are available for specialized needs, such as hemophilia or  other clotting disorders. BEFORE THE TRANSFUSION  Who gives blood for transfusions?   Healthy volunteers who are fully evaluated to make sure their blood is safe. This is blood bank blood. Transfusion therapy is the safest it has ever been in the practice of medicine. Before blood is taken from a donor, a complete history is taken to make sure that person has no history of diseases nor engages in risky social behavior (examples are intravenous drug use or sexual activity with multiple partners). The donor's travel history is screened to minimize risk of transmitting infections, such as malaria. The donated blood is tested for signs of infectious diseases, such as HIV and hepatitis. The blood is then tested to be sure it is compatible with you in order to minimize the chance of a transfusion reaction. If you or a relative donates blood, this is often done in anticipation of surgery and is not appropriate for emergency situations. It takes many days to process the donated blood. RISKS AND COMPLICATIONS Although transfusion therapy is very safe and saves many lives, the main dangers of transfusion include:   Getting an infectious disease.  Developing a transfusion reaction. This is an allergic reaction to something in the blood you were given. Every precaution is taken to prevent this. The decision to have a blood transfusion has been considered carefully by your caregiver before blood is given. Blood is not given unless the benefits outweigh the risks. AFTER THE TRANSFUSION  Right after receiving a blood transfusion, you will usually feel much better and more energetic. This is especially true if your red blood cells have gotten low (anemic). The transfusion raises the level of the red blood cells which carry oxygen, and this usually causes an energy increase.  The nurse administering the transfusion will monitor you carefully for complications. HOME  CARE INSTRUCTIONS  No special instructions are  needed after a transfusion. You may find your energy is better. Speak with your caregiver about any limitations on activity for underlying diseases you may have. SEEK MEDICAL CARE IF:   Your condition is not improving after your transfusion.  You develop redness or irritation at the intravenous (IV) site. SEEK IMMEDIATE MEDICAL CARE IF:  Any of the following symptoms occur over the next 12 hours:  Shaking chills.  You have a temperature by mouth above 102 F (38.9 C), not controlled by medicine.  Chest, back, or muscle pain.  People around you feel you are not acting correctly or are confused.  Shortness of breath or difficulty breathing.  Dizziness and fainting.  You get a rash or develop hives.  You have a decrease in urine output.  Your urine turns a dark color or changes to pink, red, or brown. Any of the following symptoms occur over the next 10 days:  You have a temperature by mouth above 102 F (38.9 C), not controlled by medicine.  Shortness of breath.  Weakness after normal activity.  The white part of the eye turns yellow (jaundice).  You have a decrease in the amount of urine or are urinating less often.  Your urine turns a dark color or changes to pink, red, or brown. Document Released: 05/15/2000 Document Revised: 08/10/2011 Document Reviewed: 01/02/2008 [facility] Patient Information 2014 [place], [place].  _______________________________________________________________________

## 2017-09-06 ENCOUNTER — Encounter (HOSPITAL_COMMUNITY)
Admission: RE | Admit: 2017-09-06 | Discharge: 2017-09-06 | Disposition: A | Discharge status: Home / Self Care | Payer: BLUE CROSS/BLUE SHIELD | Source: Ambulatory Visit | Attending: Urology | Admitting: Urology

## 2017-09-06 ENCOUNTER — Other Ambulatory Visit: Payer: Self-pay

## 2017-09-06 ENCOUNTER — Encounter (HOSPITAL_COMMUNITY): Payer: Self-pay

## 2017-09-06 LAB — BASIC METABOLIC PANEL
Anion gap: 11 (ref 5–15)
BUN: 24 mg/dL — AB (ref 6–20)
CALCIUM: 9.3 mg/dL (ref 8.9–10.3)
CO2: 25 mmol/L (ref 22–32)
CREATININE: 1.18 mg/dL (ref 0.61–1.24)
Chloride: 102 mmol/L (ref 101–111)
GFR calc Af Amer: >60 mL/min (ref >60)
GFR calc non Af Amer: >60 mL/min (ref >60)
Glucose, Bld: 84 mg/dL (ref 65–99)
Potassium: 4.2 mmol/L (ref 3.5–5.1)
SODIUM: 138 mmol/L (ref 135–145)

## 2017-09-06 LAB — ABO/RH: ABO/RH(D): A NEG

## 2017-09-06 LAB — CBC
HCT: 39.9 % (ref 39.0–52.0)
Hemoglobin: 13.2 g/dL (ref 13.0–17.0)
MCH: 29.7 pg (ref 26.0–34.0)
MCHC: 33.1 g/dL (ref 30.0–36.0)
MCV: 89.7 fL (ref 78.0–100.0)
PLATELETS: 341 10*3/uL (ref 150–400)
RBC: 4.45 MIL/uL (ref 4.22–5.81)
RDW: 12.7 % (ref 11.5–15.5)
WBC: 7.1 10*3/uL (ref 4.0–10.5)

## 2017-09-06 NOTE — Progress Notes (Addendum)
07-25-17 (Epic) CXR  10-08-16 (EKG) On chart. Note indicating that EKG is NSR is in Chart Everywhere  10-08-16 CXR on chart

## 2017-09-06 NOTE — Progress Notes (Signed)
09-06-17 BMP result routed to Dr. Berneice HeinrichManny for review.

## 2017-09-07 MED ORDER — GENTAMICIN SULFATE 40 MG/ML IJ SOLN
5.0000 mg/kg | INTRAVENOUS | Status: AC
  Administered 2017-09-08: 400 mg via INTRAVENOUS
  Filled 2017-09-07 (×2): qty 10

## 2017-09-08 ENCOUNTER — Inpatient Hospital Stay (HOSPITAL_COMMUNITY): Payer: BLUE CROSS/BLUE SHIELD | Admitting: Certified Registered Nurse Anesthetist

## 2017-09-08 ENCOUNTER — Encounter (HOSPITAL_COMMUNITY)
Admission: RE | Disposition: A | Discharge status: Home / Self Care | Payer: Self-pay | Source: Ambulatory Visit | Attending: Urology

## 2017-09-08 ENCOUNTER — Other Ambulatory Visit: Payer: Self-pay

## 2017-09-08 ENCOUNTER — Inpatient Hospital Stay (HOSPITAL_COMMUNITY)
Admission: RE | Admit: 2017-09-08 | Discharge: 2017-09-10 | DRG: 707 | Disposition: A | Discharge status: Home / Self Care | Payer: BLUE CROSS/BLUE SHIELD | Source: Ambulatory Visit | Attending: Urology | Admitting: Urology

## 2017-09-08 ENCOUNTER — Encounter (HOSPITAL_COMMUNITY): Payer: Self-pay

## 2017-09-08 DIAGNOSIS — E785 Hyperlipidemia, unspecified: Secondary | ICD-10-CM | POA: Diagnosis present

## 2017-09-08 DIAGNOSIS — I10 Essential (primary) hypertension: Secondary | ICD-10-CM | POA: Diagnosis present

## 2017-09-08 DIAGNOSIS — R338 Other retention of urine: Secondary | ICD-10-CM | POA: Diagnosis present

## 2017-09-08 DIAGNOSIS — N179 Acute kidney failure, unspecified: Secondary | ICD-10-CM | POA: Diagnosis present

## 2017-09-08 DIAGNOSIS — N401 Enlarged prostate with lower urinary tract symptoms: Secondary | ICD-10-CM | POA: Diagnosis present

## 2017-09-08 DIAGNOSIS — K4020 Bilateral inguinal hernia, without obstruction or gangrene, not specified as recurrent: Secondary | ICD-10-CM | POA: Diagnosis present

## 2017-09-08 DIAGNOSIS — N138 Other obstructive and reflux uropathy: Secondary | ICD-10-CM | POA: Diagnosis present

## 2017-09-08 DIAGNOSIS — R55 Syncope and collapse: Secondary | ICD-10-CM | POA: Diagnosis present

## 2017-09-08 HISTORY — PX: XI ROBOTIC ASSISTED SIMPLE PROSTATECTOMY: SHX6713

## 2017-09-08 LAB — GLUCOSE, CAPILLARY: GLUCOSE-CAPILLARY: 245 mg/dL — AB (ref 65–99)

## 2017-09-08 LAB — HEMOGLOBIN AND HEMATOCRIT, BLOOD
HEMATOCRIT: 36.5 % — AB (ref 39.0–52.0)
Hemoglobin: 12.5 g/dL — ABNORMAL LOW (ref 13.0–17.0)

## 2017-09-08 LAB — TYPE AND SCREEN
ABO/RH(D): A NEG
ANTIBODY SCREEN: NEGATIVE

## 2017-09-08 SURGERY — PROSTATECTOMY, SIMPLE, ROBOT-ASSISTED
Anesthesia: General

## 2017-09-08 MED ORDER — ATORVASTATIN CALCIUM 10 MG PO TABS
10.0000 mg | ORAL_TABLET | Freq: Every day | ORAL | Status: DC
  Administered 2017-09-08 – 2017-09-10 (×3): 10 mg via ORAL
  Filled 2017-09-08 (×3): qty 1

## 2017-09-08 MED ORDER — SODIUM CHLORIDE 0.9 % IV BOLUS: 1000.0000 mL | Freq: Once | INTRAVENOUS | Status: DC

## 2017-09-08 MED ORDER — LACTATED RINGERS IR SOLN
Status: DC | PRN
  Administered 2017-09-08: 1000 mL

## 2017-09-08 MED ORDER — OXYCODONE HCL 5 MG PO TABS
5.0000 mg | ORAL_TABLET | ORAL | Status: DC | PRN
  Administered 2017-09-08: 5 mg via ORAL
  Filled 2017-09-08: qty 1

## 2017-09-08 MED ORDER — HYDROMORPHONE HCL 1 MG/ML IJ SOLN
0.2500 mg | INTRAMUSCULAR | Status: DC | PRN
  Administered 2017-09-08 (×2): 0.5 mg via INTRAVENOUS

## 2017-09-08 MED ORDER — CEFAZOLIN SODIUM-DEXTROSE 2-4 GM/100ML-% IV SOLN
INTRAVENOUS | Status: AC
  Filled 2017-09-08: qty 100

## 2017-09-08 MED ORDER — CEFAZOLIN SODIUM-DEXTROSE 2-3 GM-%(50ML) IV SOLR
INTRAVENOUS | Status: DC | PRN
  Administered 2017-09-08: 2 g via INTRAVENOUS

## 2017-09-08 MED ORDER — HYDROCHLOROTHIAZIDE 12.5 MG PO CAPS
12.5000 mg | ORAL_CAPSULE | Freq: Every day | ORAL | Status: DC
  Administered 2017-09-09 – 2017-09-10 (×2): 12.5 mg via ORAL
  Filled 2017-09-08 (×2): qty 1

## 2017-09-08 MED ORDER — LACTATED RINGERS IV SOLN
INTRAVENOUS | Status: DC
  Administered 2017-09-08 (×3): via INTRAVENOUS

## 2017-09-08 MED ORDER — ONDANSETRON HCL 4 MG/2ML IJ SOLN
INTRAMUSCULAR | Status: AC
  Filled 2017-09-08: qty 2

## 2017-09-08 MED ORDER — MIDAZOLAM HCL 5 MG/5ML IJ SOLN
INTRAMUSCULAR | Status: DC | PRN
  Administered 2017-09-08: 2 mg via INTRAVENOUS

## 2017-09-08 MED ORDER — PROPOFOL 10 MG/ML IV BOLUS
INTRAVENOUS | Status: AC
  Filled 2017-09-08: qty 20

## 2017-09-08 MED ORDER — MIDAZOLAM HCL 2 MG/2ML IJ SOLN
INTRAMUSCULAR | Status: AC
  Filled 2017-09-08: qty 2

## 2017-09-08 MED ORDER — SUGAMMADEX SODIUM 200 MG/2ML IV SOLN
INTRAVENOUS | Status: DC | PRN
  Administered 2017-09-08: 200 mg via INTRAVENOUS

## 2017-09-08 MED ORDER — LIDOCAINE 2% (20 MG/ML) 5 ML SYRINGE
INTRAMUSCULAR | Status: DC | PRN
  Administered 2017-09-08: 75 mg via INTRAVENOUS

## 2017-09-08 MED ORDER — HYDROMORPHONE HCL 1 MG/ML IJ SOLN: 0.5000 mg | INTRAMUSCULAR | Status: DC | PRN

## 2017-09-08 MED ORDER — SODIUM CHLORIDE 0.9 % IJ SOLN
INTRAMUSCULAR | Status: DC | PRN
  Administered 2017-09-08: 20 mL

## 2017-09-08 MED ORDER — KETOROLAC TROMETHAMINE 30 MG/ML IJ SOLN: 30.0000 mg | Freq: Once | INTRAMUSCULAR | Status: DC | PRN

## 2017-09-08 MED ORDER — PROMETHAZINE HCL 25 MG/ML IJ SOLN: 6.2500 mg | INTRAMUSCULAR | Status: DC | PRN

## 2017-09-08 MED ORDER — ONDANSETRON HCL 4 MG/2ML IJ SOLN
INTRAMUSCULAR | Status: DC | PRN
  Administered 2017-09-08: 4 mg via INTRAVENOUS

## 2017-09-08 MED ORDER — SULFAMETHOXAZOLE-TRIMETHOPRIM 800-160 MG PO TABS: 1.0000 | ORAL_TABLET | Freq: Two times a day (BID) | ORAL | 0 refills | Status: AC

## 2017-09-08 MED ORDER — ROCURONIUM BROMIDE 10 MG/ML (PF) SYRINGE
PREFILLED_SYRINGE | INTRAVENOUS | Status: DC | PRN
  Administered 2017-09-08: 10 mg via INTRAVENOUS
  Administered 2017-09-08: 60 mg via INTRAVENOUS
  Administered 2017-09-08: 20 mg via INTRAVENOUS
  Administered 2017-09-08: 10 mg via INTRAVENOUS

## 2017-09-08 MED ORDER — DEXAMETHASONE SODIUM PHOSPHATE 10 MG/ML IJ SOLN
INTRAMUSCULAR | Status: DC | PRN
  Administered 2017-09-08: 5 mg via INTRAVENOUS

## 2017-09-08 MED ORDER — HYDROCODONE-ACETAMINOPHEN 5-325 MG PO TABS: 1.0000 | ORAL_TABLET | Freq: Four times a day (QID) | ORAL | 0 refills | Status: AC | PRN

## 2017-09-08 MED ORDER — ONDANSETRON HCL 4 MG/2ML IJ SOLN
4.0000 mg | INTRAMUSCULAR | Status: DC | PRN
  Administered 2017-09-08: 4 mg via INTRAVENOUS
  Filled 2017-09-08: qty 2

## 2017-09-08 MED ORDER — DIPHENHYDRAMINE HCL 50 MG/ML IJ SOLN: 12.5000 mg | Freq: Four times a day (QID) | INTRAMUSCULAR | Status: DC | PRN

## 2017-09-08 MED ORDER — ACETAMINOPHEN 500 MG PO TABS
1000.0000 mg | ORAL_TABLET | Freq: Four times a day (QID) | ORAL | Status: AC
  Administered 2017-09-08 – 2017-09-09 (×4): 1000 mg via ORAL
  Filled 2017-09-08 (×4): qty 2

## 2017-09-08 MED ORDER — EPHEDRINE SULFATE-NACL 50-0.9 MG/10ML-% IV SOSY
PREFILLED_SYRINGE | INTRAVENOUS | Status: DC | PRN
  Administered 2017-09-08: 10 mg via INTRAVENOUS
  Administered 2017-09-08: 5 mg via INTRAVENOUS

## 2017-09-08 MED ORDER — FENTANYL CITRATE (PF) 250 MCG/5ML IJ SOLN
INTRAMUSCULAR | Status: DC | PRN
  Administered 2017-09-08: 100 ug via INTRAVENOUS
  Administered 2017-09-08 (×2): 50 ug via INTRAVENOUS

## 2017-09-08 MED ORDER — DIPHENHYDRAMINE HCL 12.5 MG/5ML PO ELIX: 12.5000 mg | ORAL_SOLUTION | Freq: Four times a day (QID) | ORAL | Status: DC | PRN

## 2017-09-08 MED ORDER — FENTANYL CITRATE (PF) 250 MCG/5ML IJ SOLN
INTRAMUSCULAR | Status: AC
  Filled 2017-09-08: qty 5

## 2017-09-08 MED ORDER — PROPOFOL 10 MG/ML IV BOLUS
INTRAVENOUS | Status: DC | PRN
  Administered 2017-09-08: 170 mg via INTRAVENOUS

## 2017-09-08 MED ORDER — SODIUM CHLORIDE 0.9 % IJ SOLN
INTRAMUSCULAR | Status: AC
  Filled 2017-09-08: qty 20

## 2017-09-08 MED ORDER — ROCURONIUM BROMIDE 10 MG/ML (PF) SYRINGE
PREFILLED_SYRINGE | INTRAVENOUS | Status: AC
  Filled 2017-09-08: qty 5

## 2017-09-08 MED ORDER — BUPIVACAINE LIPOSOME 1.3 % IJ SUSP
20.0000 mL | Freq: Once | INTRAMUSCULAR | Status: DC
  Filled 2017-09-08: qty 20

## 2017-09-08 MED ORDER — DEXTROSE-NACL 5-0.45 % IV SOLN
INTRAVENOUS | Status: DC
  Administered 2017-09-08 – 2017-09-09 (×2): via INTRAVENOUS

## 2017-09-08 MED ORDER — BUPIVACAINE LIPOSOME 1.3 % IJ SUSP
INTRAMUSCULAR | Status: DC | PRN
  Administered 2017-09-08: 20 mL

## 2017-09-08 MED ORDER — HYDROMORPHONE HCL 1 MG/ML IJ SOLN
INTRAMUSCULAR | Status: AC
  Filled 2017-09-08: qty 1

## 2017-09-08 SURGICAL SUPPLY — 62 items
APPLICATOR COTTON TIP 6IN STRL (MISCELLANEOUS) ×3 IMPLANT
CATH FOLEY 2WAY SLVR 18FR 30CC (CATHETERS) ×3 IMPLANT
CATH FOLEY 3WAY 30CC 22FR (CATHETERS) ×3 IMPLANT
CATH TIEMANN FOLEY 18FR 5CC (CATHETERS) IMPLANT
CHLORAPREP W/TINT 26ML (MISCELLANEOUS) ×3 IMPLANT
CLIP VESOLOCK LG 6/CT PURPLE (CLIP) IMPLANT
CLOTH BEACON ORANGE TIMEOUT ST (SAFETY) ×3 IMPLANT
COVER SURGICAL LIGHT HANDLE (MISCELLANEOUS) ×3 IMPLANT
COVER TIP SHEARS 8 DVNC (MISCELLANEOUS) ×1 IMPLANT
COVER TIP SHEARS 8MM DA VINCI (MISCELLANEOUS) ×2
CUTTER ECHEON FLEX ENDO 45 340 (ENDOMECHANICALS) IMPLANT
DECANTER SPIKE VIAL GLASS SM (MISCELLANEOUS) IMPLANT
DERMABOND ADVANCED (GAUZE/BANDAGES/DRESSINGS) ×2
DERMABOND ADVANCED .7 DNX12 (GAUZE/BANDAGES/DRESSINGS) ×1 IMPLANT
DRAPE ARM DVNC X/XI (DISPOSABLE) ×4 IMPLANT
DRAPE COLUMN DVNC XI (DISPOSABLE) ×1 IMPLANT
DRAPE DA VINCI XI ARM (DISPOSABLE) ×8
DRAPE DA VINCI XI COLUMN (DISPOSABLE) ×2
DRSG TEGADERM 4X4.75 (GAUZE/BANDAGES/DRESSINGS) ×6 IMPLANT
ELECT PENCIL ROCKER SW 15FT (MISCELLANEOUS) ×3 IMPLANT
ELECT REM PT RETURN 15FT ADLT (MISCELLANEOUS) ×3 IMPLANT
GAUZE SPONGE 2X2 8PLY STRL LF (GAUZE/BANDAGES/DRESSINGS) IMPLANT
GLOVE BIO SURGEON STRL SZ 6.5 (GLOVE) ×2 IMPLANT
GLOVE BIO SURGEONS STRL SZ 6.5 (GLOVE) ×1
GLOVE BIOGEL M STRL SZ7.5 (GLOVE) ×6 IMPLANT
GLOVE BIOGEL PI IND STRL 7.5 (GLOVE) ×1 IMPLANT
GLOVE BIOGEL PI INDICATOR 7.5 (GLOVE) ×2
GOWN STRL REUS W/TWL LRG LVL3 (GOWN DISPOSABLE) ×9 IMPLANT
HOLDER FOLEY CATH W/STRAP (MISCELLANEOUS) ×3 IMPLANT
IRRIG SUCT STRYKERFLOW 2 WTIP (MISCELLANEOUS) ×3
IRRIGATION SUCT STRKRFLW 2 WTP (MISCELLANEOUS) ×1 IMPLANT
IV LACTATED RINGERS 1000ML (IV SOLUTION) ×3 IMPLANT
NEEDLE INSUFFLATION 14GA 120MM (NEEDLE) ×3 IMPLANT
PACK ROBOT UROLOGY CUSTOM (CUSTOM PROCEDURE TRAY) ×3 IMPLANT
PAD POSITIONING PINK XL (MISCELLANEOUS) ×3 IMPLANT
PORT ACCESS TROCAR AIRSEAL 12 (TROCAR) ×1 IMPLANT
PORT ACCESS TROCAR AIRSEAL 5M (TROCAR) ×2
SEAL CANN UNIV 5-8 DVNC XI (MISCELLANEOUS) ×4 IMPLANT
SEAL XI 5MM-8MM UNIVERSAL (MISCELLANEOUS) ×8
SET TRI-LUMEN FLTR TB AIRSEAL (TUBING) ×3 IMPLANT
SOLUTION ELECTROLUBE (MISCELLANEOUS) ×3 IMPLANT
SPONGE GAUZE 2X2 STER 10/PKG (GAUZE/BANDAGES/DRESSINGS)
SPONGE LAP 4X18 X RAY DECT (DISPOSABLE) ×3 IMPLANT
STAPLE RELOAD 45 GRN (STAPLE) ×1 IMPLANT
STAPLE RELOAD 45MM GREEN (STAPLE) ×2
SUT ETHIBOND 0 (SUTURE) ×15 IMPLANT
SUT ETHILON 3 0 PS 1 (SUTURE) ×3 IMPLANT
SUT MNCRL AB 4-0 PS2 18 (SUTURE) ×6 IMPLANT
SUT PDS AB 1 CT1 27 (SUTURE) ×6 IMPLANT
SUT V-LOC BARB 180 2/0GR6 GS22 (SUTURE) ×9
SUT VIC AB 0 CT1 27 (SUTURE) ×6
SUT VIC AB 0 CT1 27XBRD ANTBC (SUTURE) ×3 IMPLANT
SUT VIC AB 2-0 SH 27 (SUTURE) ×2
SUT VIC AB 2-0 SH 27X BRD (SUTURE) ×1 IMPLANT
SUT VICRYL 0 UR6 27IN ABS (SUTURE) ×3 IMPLANT
SUT VLOC BARB 180 ABS3/0GR12 (SUTURE) ×6
SUTURE V-LC BRB 180 2/0GR6GS22 (SUTURE) ×3 IMPLANT
SUTURE VLOC BRB 180 ABS3/0GR12 (SUTURE) ×2 IMPLANT
TOWEL OR 17X26 10 PK STRL BLUE (TOWEL DISPOSABLE) ×3 IMPLANT
TOWEL OR NON WOVEN STRL DISP B (DISPOSABLE) ×3 IMPLANT
TROCAR BLADELESS OPT 5 100 (ENDOMECHANICALS) IMPLANT
WATER STERILE IRR 1000ML POUR (IV SOLUTION) IMPLANT

## 2017-09-08 NOTE — Anesthesia Procedure Notes (Signed)
Procedure Name: Intubation Date/Time: 09/08/2017 11:38 AM Performed by: Lollie Sails, CRNA Pre-anesthesia Checklist: Patient identified, Emergency Drugs available, Suction available, Patient being monitored and Timeout performed Patient Re-evaluated:Patient Re-evaluated prior to induction Oxygen Delivery Method: Circle system utilized Preoxygenation: Pre-oxygenation with 100% oxygen Induction Type: IV induction Ventilation: Mask ventilation without difficulty Laryngoscope Size: Mac and 4 Grade View: Grade I Tube type: Oral Tube size: 7.5 mm Number of attempts: 1 Airway Equipment and Method: Stylet Placement Confirmation: ETT inserted through vocal cords under direct vision,  positive ETCO2 and breath sounds checked- equal and bilateral Secured at: 23 cm Tube secured with: Tape Dental Injury: Teeth and Oropharynx as per pre-operative assessment

## 2017-09-08 NOTE — Anesthesia Postprocedure Evaluation (Signed)
Anesthesia Post Note  Patient: Carlos Bradley  Procedure(s) Performed: XI ROBOTIC ASSISTED SIMPLE PROSTATECTOMY WITH BILATERAL INGUINAL HERNIA REPAIR (N/A )     Patient location during evaluation: PACU Anesthesia Type: General Level of consciousness: sedated Pain management: pain level controlled Vital Signs Assessment: post-procedure vital signs reviewed and stable Respiratory status: spontaneous breathing and respiratory function stable Cardiovascular status: stable Postop Assessment: no apparent nausea or vomiting Anesthetic complications: no    Last Vitals:  Vitals:   09/08/17 1545 09/08/17 1604  BP:  139/71  Pulse:  80  Resp:  18  Temp: 36.8 C 36.8 C  SpO2:  100%    Last Pain:  Vitals:   09/08/17 1606  TempSrc:   PainSc: 3                  Bladimir Auman DANIEL

## 2017-09-08 NOTE — Brief Op Note (Signed)
09/08/2017  2:02 PM  PATIENT:  Sharion DoveSteven Grizzell  62 y.o. male  PRE-OPERATIVE DIAGNOSIS:  VERY LARGE PROSTATE  POST-OPERATIVE DIAGNOSIS:  VERY LARGE PROSTATE  PROCEDURE:  Procedure(s): XI ROBOTIC ASSISTED SIMPLE PROSTATECTOMY WITH BILATERAL INGUINAL HERNIA REPAIR (N/A)  SURGEON:  Surgeon(s) and Role:    * Sebastian AcheManny, Onelia Cadmus, MD - Primary  PHYSICIAN ASSISTANT:   ASSISTANTS: Flo ShanksAmanda Dancey PA   ANESTHESIA:   local and general  EBL:  100 mL   BLOOD ADMINISTERED:none  DRAINS: 1 - JP to bulb, 2 - Foley to gravity   LOCAL MEDICATIONS USED:  MARCAINE     SPECIMEN:  Source of Specimen:  prostate adenoma  DISPOSITION OF SPECIMEN:  PATHOLOGY  COUNTS:  YES  TOURNIQUET:  * No tourniquets in log *  DICTATION: .Other Dictation: Dictation Number  [id number]  PLAN OF CARE: Admit to inpatient   PATIENT DISPOSITION:  PACU - hemodynamically stable.   Delay start of Pharmacological VTE agent (>24hrs) due to surgical blood loss or risk of bleeding: yes

## 2017-09-08 NOTE — Anesthesia Preprocedure Evaluation (Signed)
Anesthesia Evaluation  Patient identified by MRN, date of birth, ID band Patient awake    Reviewed: Allergy & Precautions, NPO status , Patient's Chart, lab work & pertinent test results  Airway Mallampati: II  TM Distance: >3 FB Neck ROM: Full    Dental no notable dental hx.    Pulmonary neg pulmonary ROS,    Pulmonary exam normal breath sounds clear to auscultation       Cardiovascular hypertension, Normal cardiovascular exam Rhythm:Regular Rate:Normal     Neuro/Psych negative neurological ROS  negative psych ROS   GI/Hepatic negative GI ROS, Neg liver ROS,   Endo/Other  negative endocrine ROS  Renal/GU negative Renal ROS  negative genitourinary   Musculoskeletal negative musculoskeletal ROS (+)   Abdominal   Peds negative pediatric ROS (+)  Hematology negative hematology ROS (+)   Anesthesia Other Findings   Reproductive/Obstetrics negative OB ROS                             Anesthesia Physical Anesthesia Plan  ASA: II  Anesthesia Plan: General   Post-op Pain Management:    Induction: Intravenous  PONV Risk Score and Plan: 2 and Dexamethasone, Ondansetron and Treatment may vary due to age or medical condition  Airway Management Planned: Oral ETT  Additional Equipment:   Intra-op Plan:   Post-operative Plan: Extubation in OR  Informed Consent: I have reviewed the patients History and Physical, chart, labs and discussed the procedure including the risks, benefits and alternatives for the proposed anesthesia with the patient or authorized representative who has indicated his/her understanding and acceptance.     Dental advisory given  Plan Discussed with: CRNA and Surgeon  Anesthesia Plan Comments:         Anesthesia Quick Evaluation  

## 2017-09-08 NOTE — H&P (Signed)
Carlos Bradley is an 62 y.o. male.    Chief Complaint: Pre-op Robotic Simple Prostatectomy  HPI:   1- Urinary Retention / Acute Renal Failure / Enlarged Prostate - new retention 07/2017 requiring ER catheter, Cr 2.3 up from normal baseline. CT at time with trilobar prostatic hypertrophy with vol 145m and median lobe. FU Cr 1.0 with foley. Failed TOV x several on double dose alpha blocker. UDS 07/2017 confirm obstructed pattern with PDet 84 at Qm 1.431m Cysto 08/2017 confirms large trilbar hypertrophy.   PMH sig for mild HTN, HLD. No prior surgeries. He works for [employer] makes [employer], travels extensitvely,. His PCP is KeLoma BostonD with [facility].   Today "StDavionteis seen to proceed with robotic simple prostatectomy. No interval fevers. He has been on Bactrim proph given prolonged catheterization.    Past Medical History:  Diagnosis Date  . Hypertension    tx due to  family hx    Past Surgical History:  Procedure Laterality Date  . wisdom teeth      Family History  Problem Relation Age of Onset  . Hypertension Mother   . Hypertension Father    Social History:  reports that he has never smoked. He has never used smokeless tobacco. He reports that he does not drink alcohol or use drugs.  Allergies:  Allergies  Allergen Reactions  . Azithromycin Rash  . Codeine Nausea Only  . TuGeralyn FlashFish Allergy] Nausea And Vomiting    No medications prior to admission.    Results for orders placed or performed during the hospital encounter of 09/06/17 (from the past 48 hour(s))  Basic metabolic panel     Status: Abnormal   Collection Time: 09/06/17 11:44 AM  Result Value Ref Range   Sodium 138 135 - 145 mmol/L   Potassium 4.2 3.5 - 5.1 mmol/L   Chloride 102 101 - 111 mmol/L   CO2 25 22 - 32 mmol/L   Glucose, Bld 84 65 - 99 mg/dL   BUN 24 (H) 6 - 20 mg/dL   Creatinine, Ser 1.18 0.61 - 1.24 mg/dL   Calcium 9.3 8.9 - 10.3 mg/dL   GFR calc non Af  Amer >60 >60 mL/min   GFR calc Af Amer >60 >60 mL/min    Comment: (NOTE) The eGFR has been calculated using the CKD EPI equation. This calculation has not been validated in all clinical situations. eGFR's persistently <60 mL/min signify possible Chronic Kidney Disease.    Anion gap 11 5 - 15    Comment: Performed at [facility] [place] [id number]     Status: None   Collection Time: 09/06/17 11:44 AM  Result Value Ref Range   WBC 7.1 4.0 - 10.5 K/uL   RBC 4.45 4.22 - 5.81 MIL/uL   Hemoglobin 13.2 13.0 - 17.0 g/dL   HCT 39.9 39.0 - 52.0 %   MCV 89.7 78.0 - 100.0 fL   MCH 29.7 26.0 - 34.0 pg   MCHC 33.1 30.0 - 36.0 g/dL   RDW 12.7 11.5 - 15.5 %   Platelets 341 150 - 400 K/uL    Comment: Performed at [facility] [place] [id number] and screen [facility] [place]   Status: None   Collection Time: 09/06/17 11:55 AM  Result Value Ref Range   ABO/RH(D) A NEG    Antibody Screen NEG    Sample  Expiration 09/11/2017    Extend sample reason      NO TRANSFUSIONS OR PREGNANCY IN THE PAST 3 MONTHS Performed at [facility] [address], [place], [place] [address]   ABO/Rh     Status: None   Collection Time: 09/06/17 11:55 AM  Result Value Ref Range   ABO/RH(D)      A NEG Performed at [facility] [address], [place], [place] [address]    No results found.  Review of Systems  Constitutional: Negative for chills and fever.  HENT: Negative.   Eyes: Negative.   Respiratory: Negative.   Cardiovascular: Negative.   Gastrointestinal: Negative.   Genitourinary: Positive for urgency. Negative for flank pain.  Musculoskeletal: Negative.   Skin: Negative.   Neurological: Negative.   Endo/Heme/Allergies: Negative.   Psychiatric/Behavioral: Negative.     There were no vitals taken for this visit. Physical Exam  Constitutional: He  appears well-developed.  HENT:  Head: Normocephalic.  Eyes: Pupils are equal, round, and reactive to light.  Neck: Normal range of motion.  Cardiovascular: Normal rate.  Respiratory: Effort normal.  GI: Soft.  Genitourinary:  Genitourinary Comments: Catheter in situ with yellow urine.   Musculoskeletal: Normal range of motion.  Neurological: He is alert.  Skin: Skin is warm.  Psychiatric: He has a normal mood and affect.     Assessment/Plan  Proceed as planned with robotic simple prostatectomy. Risks, benefits, alternatives, expected peri-op course discussed previously and reiterated today.   Alexis Frock, MD 09/08/2017, 6:20 AM

## 2017-09-08 NOTE — Transfer of Care (Signed)
Immediate Anesthesia Transfer of Care Note  Patient: Carlos DoveSteven Bradley  Procedure(s) Performed: XI ROBOTIC ASSISTED SIMPLE PROSTATECTOMY WITH BILATERAL INGUINAL HERNIA REPAIR (N/A )  Patient Location: PACU  Anesthesia Type:General  Level of Consciousness: awake, alert  and oriented  Airway & Oxygen Therapy: Patient Spontanous Breathing and Patient connected to face mask oxygen  Post-op Assessment: Report given to RN and Post -op Vital signs reviewed and stable  Post vital signs: Reviewed and stable  Last Vitals:  Vitals Value Taken Time  BP 146/64 09/08/2017  2:21 PM  Temp    Pulse 70 09/08/2017  2:22 PM  Resp 20 09/08/2017  2:24 PM  SpO2 100 % 09/08/2017  2:22 PM  Vitals shown include unvalidated device data.  Last Pain:  Vitals:   09/08/17 1039  TempSrc:   PainSc: 0-No pain         Complications: No apparent anesthesia complications

## 2017-09-08 NOTE — Discharge Instructions (Signed)

## 2017-09-09 LAB — BASIC METABOLIC PANEL
ANION GAP: 8 (ref 5–15)
BUN: 16 mg/dL (ref 6–20)
CALCIUM: 8.3 mg/dL — AB (ref 8.9–10.3)
CO2: 24 mmol/L (ref 22–32)
CREATININE: 1.31 mg/dL — AB (ref 0.61–1.24)
Chloride: 101 mmol/L (ref 101–111)
GFR calc Af Amer: >60 mL/min (ref >60)
GFR, EST NON AFRICAN AMERICAN: 58 mL/min — AB (ref >60)
GLUCOSE: 202 mg/dL — AB (ref 65–99)
Potassium: 4.9 mmol/L (ref 3.5–5.1)
Sodium: 133 mmol/L — ABNORMAL LOW (ref 135–145)

## 2017-09-09 LAB — CREATININE, FLUID (PLEURAL, PERITONEAL, JP DRAINAGE): CREAT FL: 1.6 mg/dL

## 2017-09-09 LAB — HEMOGLOBIN AND HEMATOCRIT, BLOOD
HCT: 29.4 % — ABNORMAL LOW (ref 39.0–52.0)
Hemoglobin: 10.1 g/dL — ABNORMAL LOW (ref 13.0–17.0)

## 2017-09-09 MED ORDER — SODIUM CHLORIDE 0.9 % IV SOLN
INTRAVENOUS | Status: DC
  Administered 2017-09-09: 15:00:00 via INTRAVENOUS

## 2017-09-09 MED ORDER — ACETAMINOPHEN 500 MG PO TABS
1000.0000 mg | ORAL_TABLET | Freq: Four times a day (QID) | ORAL | Status: DC | PRN
  Administered 2017-09-09 – 2017-09-10 (×2): 1000 mg via ORAL
  Filled 2017-09-09 (×2): qty 2

## 2017-09-09 NOTE — Progress Notes (Signed)
1 Day Post-Op   Subjective/Chief Complaint:  1 - Large Prostate With Urinary Retention - s/p robotic simple prostatectomy 09/08/17 day of admission. Admitted to 4th floor urology service post-op. JP Cr same as serum POD1.  2 - Vasovagal Episode - pt with vagal episode x POD 1, once when drain tubing clogged and another with attempt standing first time post-op. Glucose normal, HR normal.  Today Carlos Bradley is stable, but had 2 vagal episodes. He is tollerating PO intake and pain controlled mostkly on PO tylenol.    Objective: Vital signs in last 24 hours: Temp:  [97.7 F (36.5 C)-98.4 F (36.9 C)] 98.2 F (36.8 C) (04/11 1243) Pulse Rate:  [66-75] 66 (04/11 1243) Resp:  [16-26] 20 (04/11 1012) BP: (100-132)/(58-76) 127/76 (04/11 1243) SpO2:  [96 %-100 %] 99 % (04/11 1243) Last BM Date: 09/08/17  Intake/Output from previous day: 04/10 0701 - 04/11 0700 In: 4751.7 [P.O.:520; I.V.:3231.7; IV Piggyback:1000] Out: 2105 [Urine:1650; Drains:355; Blood:100] Intake/Output this shift: No intake/output data recorded.  General appearance: alert, cooperative and appears stated age Head: Normocephalic, without obvious abnormality, atraumatic Nose: Nares normal. Septum midline. Mucosa normal. No drainage or sinus tenderness. Throat: lips, mucosa, and tongue normal; teeth and gums normal Neck: supple, symmetrical, trachea midline Resp: non-labored on room air.  Cardio: Nl rate and rythem x 2 today Male genitalia: normal, foley in situ with medium yellow urine and scant debris.  Extremities: extremities normal, atraumatic, no cyanosis or edema Pulses: 2+ and symmetric Skin: Skin color, texture, turgor normal. No rashes or lesions Neurologic: Grossly normal Incision/Wound: recent port sites c/d/i. JPwith serosanguinous output that is non-foul.   Lab Results:  Recent Labs    09/08/17 1428 09/09/17 0420  HGB 12.5* 10.1*  HCT 36.5* 29.4*   BMET Recent Labs    09/09/17 0420  NA 133*  K  4.9  CL 101  CO2 24  GLUCOSE 202*  BUN 16  CREATININE 1.31*  CALCIUM 8.3*   PT/INR No results for input(s): LABPROT, INR in the last 72 hours. ABG No results for input(s): PHART, HCO3 in the last 72 hours.  Invalid input(s): PCO2, PO2  Studies/Results: No results found.  Anti-infectives: Anti-infectives (From admission, onward)   Start     Dose/Rate Route Frequency Ordered Stop   09/08/17 0600  gentamicin (GARAMYCIN) 400 mg in dextrose 5 % 100 mL IVPB     5 mg/kg  79.6 kg 220 mL/hr over 30 Minutes Intravenous 30 min pre-op 09/07/17 1255 09/08/17 1240   09/08/17 0000  sulfamethoxazole-trimethoprim (BACTRIM DS,SEPTRA DS) 800-160 MG tablet     1 tablet Oral 2 times daily 09/08/17 1030        Assessment/Plan:  1 - Large Prostate With Urinary Retention - doing well POD 1. Continue foley. Likely DC JP tomorrow as Cr same as serum.  2 - Vasovagal Episode - likely from obtruction of drain. Repeat BMP tomorrow. If persistant tomorrow then medicine consult.   [facility]townMANNY, Carlos Bradley 09/09/2017

## 2017-09-09 NOTE — Progress Notes (Signed)
Patient asked to ambulate with spouse. Sat on side of bed for approx 10 min. Generalized weakness. Pt. stated he was nervous to get up after his syncopal episode last night. Pt. became diaphoretic and pale. He stated "I feel bad again.' Assisted back to laying position. Eyes rolled back, and non-verbal for 3-5 sec after which he became alert and responsive. VSS. Denies pain or nausea. IVF infusing at 100cc/hr.

## 2017-09-09 NOTE — Progress Notes (Signed)
Pt. Made staff aware that his JP site was leaking. Upon assessment, pt's gown had blood in the area of JP site with moderate amount of drainage. JP bulb was drained and had an output of 80 mL. JP site had moderate blood drainage, dressing reinforced. While pt. Was being washed up, pt. Stated he was dizzy. Pt. Lost consciousness for about 5 seconds. Pt. Came to A&O x4. Vital signs obtained 132/73 BP, HR 71, RR 26, Temp 98.1, O2 99 on room air. Pt. Complained of no pain. On call NP, Warden paged and made aware. No new orders. Will make oncoming shift aware and will continue to monitor pt. Closely

## 2017-09-09 NOTE — Op Note (Signed)
NAME:  QUAYSHAWN, NIN NO.:  MEDICAL RECORD NO.:  [id number]  LOCATION:                                 FACILITY:  PHYSICIAN:  Alexis Frock, MD          DATE OF BIRTH:  DATE OF PROCEDURE: 09/08/2017                              OPERATIVE REPORT   DIAGNOSIS:  Refractory prostatic hypertrophy with urinary retention.  PROCEDURES: 1. Robotic-assisted laparoscopic simple prostatectomy. 2. Bilateral inguinal hernia repair.  ESTIMATED BLOOD LOSS:  100 cc.  COMPLICATION:  None.  SPECIMEN:  Prostate adenoma for permanent pathology.  FINDINGS: 1. Trilobar prostatic hypertrophy, median lobe, at least 50% of     prostate volume. 2. Resolution of all obstructing adenomatous tissue following this and     prostatectomy. 3. Large bilateral indirect inguinal hernias following development of     space of Retzius. 4. Resolution of bilateral indirect hernias following bilateral hernia     repair.  DRAINS: 1. Jackson-Pratt drain to bulb suction. 2. Foley catheter to straight drain.  ASSISTANT:  Debbrah Alar, PA  INDICATION:  The patient is a very pleasant 62 year old gentleman, otherwise quite healthy.  He has had a prodrome of obstructing voiding for quite some time.  He went up an acute urinary retention with renal failure and bilateral hydronephrosis.  Recently, he was placed on alpha- blockers maximally.  He has failed trial of void times several.  He underwent imaging as well as urodynamics and cystoscopy, which revealed a very large trilobar prostatic hypertrophy approximately 100 g and preserved bladder contractions with low flow consistent with obstruction at the level of the prostate.  His PSA has been normal previously. Options were discussed for further management including continued trial of medical therapy with adding 5-alpha reductase inhibitors versus outlet procedure with TURP versus simple prostatectomy at its size with latter being likely  most definitive, and he wished to proceed with that. Informed consent was obtained and placed in the medical record.  PROCEDURE IN DETAIL:  The patient being, the patient, was verified. Procedure being robotic simple prostatectomy was confirmed.  Procedure was carried out.  Time-out was performed.  Intravenous antibiotics were administered.  General endotracheal anesthesia was introduced.  The patient was placed into a low lithotomy position.  Sterile field was created by prepping and draping the patient's penis, perineum and proximal thighs using iodine and his infra-xiphoid abdomen using chlorhexidine gluconate.  He was further fashioned to the operating table using 3-inch tape over foam padding across the supraxiphoid chest. Next, a high-flow, low-pressure pneumoperitoneum was obtained using Veress technique in the supraumbilical midline having passed the aspiration and drop test after Foley catheter was placed per urethra to straight drain.  8-mm robotic camera port was placed in the same location.  Laparoscopic examination of the peritoneal cavity revealed no significant adhesions and no visceral injury.  Additional ports were then placed as follows:  Right paramedian 8-mm robotic port, right far lateral 12-mm AirSeal assistant port, right paramedian 5-mm suction port, left paramedian 8-mm robotic port, left far lateral 8-mm robotic port.  Robot was docked and passed through the electronic checks. Initial attention was directed at development of  space of Retzius.  An incision was made lateral to the left medial umbilical ligament from the midline towards the inguinal ring coursing along the iliac vessels towards the area of the left vas deferens, which was not transected and the left bladder wall was swept way towards the area of the pubic ramus and endopelvic fascia on the left side.  A mirror-imaged dissection was performed on the right side.  The bladder neck-prostate junction was  de- fatted to better demarcate this and the endopelvic fascia was carefully swept away from the apex and lateral aspect of the prostate just enough to expose the dorsal venous complex, which was controlled using vascular load stapler, taken exquisite care to avoid membranous urethral injury, which did not occur.  Next, the inverse similar incision was made approximately 1 cm proximal to the area of the bladder neck-prostate junction, which exposed the area of the intra-bladder prostate down to the area of the median lobe.  The ureteral orifices were respectively visualized.  Figure-of-eight 0 Vicryl stay sutures were applied each to the right lobe, the left lobe and the median lobe of the prostate. Circumferential mucosal incision was made beginning posteriorly and extending anteriorly.  The adenoma plane was then entered posteriorly and traced towards the area of the apex and this adenoma plane was further developed first on the left side, then on the right side towards the area of the anterior.  Final apical dissection was performed in the anterior plane by bisecting the prostate adenoma along the Foley catheter and freeing apical attachments just completely freed up the adenomatous specimen, was placed into an EndoCatch bag for later retrieval.  Prostate capsule was inspected and completely intact. Hemostasis appeared excellent.  Attention was directed at mucosal advancement.  A double-armed 3-0 V-Loc suture was used to advance the posterior bladder neck mucosa to the membranous urethra from 3 o'clock to the 9 o'clock positions forming an excellent mucosal bridge to prevent bladder-neck contracture.  The orifices were minimally displaced and had excellent patency of urine visually.  The bladder neck was then closed using two separate running suture lines of 2-0 V-Loc, which met in the middle.  Finally, a new 3-way Foley catheter was placed per urethra to straight drain, 20 cc sterile  water in the balloon, which irrigated quantitatively.  The irrigation port was plugged.  The development of space of Retzius had an exposed quite large bilateral indirect inguinal hernias.  Given the patient's relatively thin body habitus, it was felt that this likely would become clinically significant almost immediately postoperatively.  It was clearly felt that repair was warranted.  As such, figure-of-eight Ethibond was used x2 each side reapproximating the edges of the fascia.  Taken exquisite care not to strangulate the gonadal structures or the iliac vessels. This resulted in excellent resolution of the bilateral indirect hernias. Next, a closed suction drain was brought through the previous left lateral most robotic port site to the area of the peritoneal cavity. Robot was then undocked.  The previous right lateral most assistant port site was closed at the level of the fascia using Carter-Thomason suture passer and 0 Vicryl.  Specimen was retrieved by extending the previous camera port site superiorly for distance approximately 2.5 cm removing the adenomatous specimen and setting it aside for permanent pathology. The extraction site was closed at the level of the fascia using figure- of-eight PDS x2.  All incision sites were infiltrated with dilute lyophilized Marcaine and closed at the level of the skin  using subcuticular Monocryl followed by Dermabond.  Procedure was then terminated.  The patient tolerated the procedure well.  There were no immediate periprocedural complications.  The patient was taken to the postanesthesia care unit in stable condition.  Please note, first assistant, Debbrah Alar, was absolutely crucial for all robotic portions of the procedure today.  She provided invaluable retraction, suctioning, suture passage, vascular stapling; without which, this would not be possible.          ______________________________ Alexis Frock, MD     TM/MEDQ  D:   09/08/2017  T:  09/08/2017  Job:  [id number]

## 2017-09-10 LAB — BASIC METABOLIC PANEL
ANION GAP: 8 (ref 5–15)
BUN: 11 mg/dL (ref 6–20)
CALCIUM: 8.7 mg/dL — AB (ref 8.9–10.3)
CO2: 27 mmol/L (ref 22–32)
CREATININE: 1.12 mg/dL (ref 0.61–1.24)
Chloride: 104 mmol/L (ref 101–111)
GFR calc non Af Amer: >60 mL/min (ref >60)
Glucose, Bld: 104 mg/dL — ABNORMAL HIGH (ref 65–99)
Potassium: 4.2 mmol/L (ref 3.5–5.1)
Sodium: 139 mmol/L (ref 135–145)

## 2017-09-10 LAB — HEMOGLOBIN AND HEMATOCRIT, BLOOD
HEMATOCRIT: 26.3 % — AB (ref 39.0–52.0)
HEMOGLOBIN: 8.9 g/dL — AB (ref 13.0–17.0)

## 2017-09-10 MED ORDER — SENNOSIDES-DOCUSATE SODIUM 8.6-50 MG PO TABS: 1.0000 | ORAL_TABLET | Freq: Two times a day (BID) | ORAL | 0 refills | Status: AC

## 2017-09-10 NOTE — Progress Notes (Signed)
Patient ambulated in hall (360 feet) without incident. Tolerated well.

## 2017-09-10 NOTE — Progress Notes (Signed)
Patient given discharge, follow up, medication, and foley catheter care and bag instructions, verbalized understanding, IV removed, prescriptions and personal belongings with patient, family to transfer home

## 2017-09-10 NOTE — Discharge Summary (Signed)
[facility] Discharge Summary  Admit date: 09/08/2017  Discharge date and time: 09/10/17   Discharge to: Home  Discharge Service: Urology  Discharge Attending Physician:  Dr. Berneice Heinrich  Discharge  Diagnoses: BPH, bilateral inguinal hernias  Secondary Diagnosis: Active Problems:   BPH with obstruction/lower urinary tract symptoms   OR Procedures: Procedure(s): XI ROBOTIC ASSISTED SIMPLE PROSTATECTOMY WITH BILATERAL INGUINAL HERNIA REPAIR 09/08/2017   Ancillary Procedures: None   Discharge Day Services: The patient was seen and examined by the Urology team both in the morning and immediately prior to discharge.  Vital signs and laboratory values were stable and within normal limits.  The physical exam was benign and unchanged and all surgical wounds were examined.  Discharge instructions were explained and all questions answered. Final pathology reviewed benign and copy given to patient.   Subjective  No acute events overnight. Pain Controlled. No fever or chills.  Objective Patient Vitals for the past 8 hrs:  BP Temp Temp src Pulse Resp SpO2  09/10/17 0519 137/73 97.9 F (36.6 C) Oral 88 20 98 %   No intake/output data recorded.  General Appearance:        No acute distress Lungs:                       Normal work of breathing on room air Heart:                                Regular rate and rhythm Abdomen:                         Soft, non-tender, non-distended. Incisions c/d/i. Former JP site dressed with gauze.  GU:        3-way Foley in place draining clear yellow urine. Extremities:                      Warm and well perfused   Hospital Course:  The patient underwent robotic simple prostatectomy and bilateral inguinal hernia repair on 09/08/2017.  The patient tolerated the procedure well, was extubated in the OR, and afterwards was taken to the PACU for routine post-surgical care. When stable the patient was transferred to the floor.   The patient did well  postoperatively.  The patient's diet was slowly advanced and at the time of discharge was tolerating a regular diet.  The patient was discharged home 2 Days Post-Op, at which point was tolerating a regular solid diet, was able to void spontaneously, have adequate pain control with P.O. pain medication, and could ambulate without difficulty. The patient will follow up with [place] for post op check.  JP removed AM of dischargs as Cr same as serum and output scant overnighit.   Condition at Discharge: Improved  Discharge Medications:  Allergies as of 09/10/2017      Reactions   Azithromycin Rash   Codeine Nausea Only   Tuna [fish Allergy] Nausea And Vomiting      Medication List    STOP taking these medications   ALIGN PO   Fish Oil 1200 MG Caps   multivitamin with minerals Tabs tablet   naproxen sodium 220 MG tablet Commonly known as:  ALEVE   OSTEO BI-FLEX REGULAR STRENGTH PO   tamsulosin 0.4 MG Caps capsule Commonly known as:  FLOMAX   vitamin C 500 MG tablet Commonly known as:  ASCORBIC ACID  TAKE these medications   atorvastatin 10 MG tablet Commonly known as:  LIPITOR Take 10 mg by mouth daily.   hydrochlorothiazide 12.5 MG capsule Commonly known as:  MICROZIDE Take 12.5 mg by mouth daily.   HYDROcodone-acetaminophen 5-325 MG tablet Commonly known as:  NORCO Take 1-2 tablets by mouth every 6 (six) hours as needed for moderate pain or severe pain.   ondansetron 4 MG disintegrating tablet Commonly known as:  ZOFRAN ODT 4mg  ODT q4 hours prn nausea/vomit   sulfamethoxazole-trimethoprim 800-160 MG tablet Commonly known as:  BACTRIM DS,SEPTRA DS Take 1 tablet by mouth 2 (two) times daily. Start the day prior to foley removal appointment   SYSTANE COMPLETE OP Apply 2 drops to eye 2 (two) times daily.

## 2017-09-17 ENCOUNTER — Encounter (HOSPITAL_COMMUNITY): Payer: Self-pay | Admitting: Emergency Medicine

## 2017-09-17 ENCOUNTER — Emergency Department (HOSPITAL_COMMUNITY)
Admission: EM | Admit: 2017-09-17 | Discharge: 2017-09-17 | Disposition: A | Discharge status: Home / Self Care | Payer: BLUE CROSS/BLUE SHIELD | Attending: Emergency Medicine | Admitting: Emergency Medicine

## 2017-09-17 DIAGNOSIS — Z9889 Other specified postprocedural states: Secondary | ICD-10-CM | POA: Insufficient documentation

## 2017-09-17 DIAGNOSIS — Z79899 Other long term (current) drug therapy: Secondary | ICD-10-CM | POA: Diagnosis not present

## 2017-09-17 DIAGNOSIS — R82998 Other abnormal findings in urine: Secondary | ICD-10-CM | POA: Diagnosis present

## 2017-09-17 DIAGNOSIS — Z9079 Acquired absence of other genital organ(s): Secondary | ICD-10-CM | POA: Insufficient documentation

## 2017-09-17 LAB — CBC WITH DIFFERENTIAL/PLATELET
Basophils Absolute: 0 10*3/uL (ref 0.0–0.1)
Basophils Relative: 0 %
EOS ABS: 0.2 10*3/uL (ref 0.0–0.7)
EOS PCT: 2 %
HCT: 30.4 % — ABNORMAL LOW (ref 39.0–52.0)
Hemoglobin: 10.2 g/dL — ABNORMAL LOW (ref 13.0–17.0)
LYMPHS ABS: 1.6 10*3/uL (ref 0.7–4.0)
LYMPHS PCT: 15 %
MCH: 30.4 pg (ref 26.0–34.0)
MCHC: 33.6 g/dL (ref 30.0–36.0)
MCV: 90.7 fL (ref 78.0–100.0)
MONO ABS: 1.1 10*3/uL — AB (ref 0.1–1.0)
MONOS PCT: 10 %
Neutro Abs: 7.9 10*3/uL — ABNORMAL HIGH (ref 1.7–7.7)
Neutrophils Relative %: 73 %
PLATELETS: 430 10*3/uL — AB (ref 150–400)
RBC: 3.35 MIL/uL — ABNORMAL LOW (ref 4.22–5.81)
RDW: 14.7 % (ref 11.5–15.5)
WBC: 10.7 10*3/uL — ABNORMAL HIGH (ref 4.0–10.5)

## 2017-09-17 LAB — COMPREHENSIVE METABOLIC PANEL
ALT: 73 U/L — AB (ref 17–63)
AST: 55 U/L — ABNORMAL HIGH (ref 15–41)
Albumin: 3.9 g/dL (ref 3.5–5.0)
Alkaline Phosphatase: 61 U/L (ref 38–126)
Anion gap: 11 (ref 5–15)
BUN: 21 mg/dL — ABNORMAL HIGH (ref 6–20)
CHLORIDE: 100 mmol/L — AB (ref 101–111)
CO2: 27 mmol/L (ref 22–32)
CREATININE: 0.96 mg/dL (ref 0.61–1.24)
Calcium: 9.8 mg/dL (ref 8.9–10.3)
GFR calc Af Amer: >60 mL/min (ref >60)
GLUCOSE: 112 mg/dL — AB (ref 65–99)
Potassium: 3.9 mmol/L (ref 3.5–5.1)
SODIUM: 138 mmol/L (ref 135–145)
Total Bilirubin: 1.4 mg/dL — ABNORMAL HIGH (ref 0.3–1.2)
Total Protein: 7.4 g/dL (ref 6.5–8.1)

## 2017-09-17 LAB — URINALYSIS, ROUTINE W REFLEX MICROSCOPIC
Bilirubin Urine: NEGATIVE
GLUCOSE, UA: NEGATIVE mg/dL
Ketones, ur: NEGATIVE mg/dL
NITRITE: NEGATIVE
PROTEIN: 30 mg/dL — AB
Specific Gravity, Urine: 1.006 (ref 1.005–1.030)
Squamous Epithelial / LPF: NONE SEEN
pH: 6 (ref 5.0–8.0)

## 2017-09-17 LAB — CK: CK TOTAL: 140 U/L (ref 49–397)

## 2017-09-17 MED ORDER — SODIUM CHLORIDE 0.9 % IV BOLUS
1000.0000 mL | Freq: Once | INTRAVENOUS | Status: AC
  Administered 2017-09-17: 1000 mL via INTRAVENOUS

## 2017-09-17 MED ORDER — SODIUM CHLORIDE 0.9 % IV BOLUS: 1000.0000 mL | Freq: Once | INTRAVENOUS | Status: DC

## 2017-09-17 NOTE — ED Provider Notes (Signed)
[facility]-EMERGENCY DEPT Provider Note   CSN: [id number] Arrival date & time: 09/17/17  1222     History   Chief Complaint Chief Complaint  Patient presents with  . dark urine    HPI Carlos Bradley is a 62 y.o. male with a history of hypertension, BPH with obstruction status post robot-assisted simple prostatectomy with bilateral inguinal hernia repair on 09/08/17 by Dr. Berneice Heinrich, who presents today for evaluation of darkened urine.  He reports that he has a Foley in place since his prostatectomy.  His urine has gotten darker in color on Wednesday.  He increased his fluid intake and this cleared his urine and reports that it got dark again last night and then again today.  He reports that he is scheduled to get the catheter removed on Tuesday and have his follow-up then.  He denies any fevers or chills, no nausea vomiting or diarrhea.  He reports that he is not having increased pain, is no longer taking any pain medicines and is doing well postop.  No other concerns today.  HPI  Past Medical History:  Diagnosis Date  . Hypertension    tx due to  family hx    Patient Active Problem List   Diagnosis Date Noted  . BPH with obstruction/lower urinary tract symptoms 09/08/2017    Past Surgical History:  Procedure Laterality Date  . wisdom teeth    . XI ROBOTIC ASSISTED SIMPLE PROSTATECTOMY N/A 09/08/2017   Procedure: XI ROBOTIC ASSISTED SIMPLE PROSTATECTOMY WITH BILATERAL INGUINAL HERNIA REPAIR;  Surgeon: Sebastian Ache, MD;  Location: [facility] ORS;  Service: Urology;  Laterality: N/A;        Home Medications    Prior to Admission medications   Medication Sig Start Date End Date Taking? Authorizing Provider  atorvastatin (LIPITOR) 10 MG tablet Take 10 mg by mouth daily. 05/14/17  Yes [provider]  hydrochlorothiazide (MICROZIDE) 12.5 MG capsule Take 12.5 mg by mouth daily. 07/22/17  Yes [provider]  Propylene Glycol (SYSTANE COMPLETE OP)  Apply 2 drops to eye 2 (two) times daily.   Yes [provider]  HYDROcodone-acetaminophen (NORCO) 5-325 MG tablet Take 1-2 tablets by mouth every 6 (six) hours as needed for moderate pain or severe pain. Patient not taking: Reported on 09/17/2017 09/08/17   Harrie Foreman, PA-C  ondansetron (ZOFRAN ODT) 4 MG disintegrating tablet 4mg  ODT q4 hours prn nausea/vomit Patient not taking: Reported on 09/02/2017 07/25/17   Dartha Lodge, PA-C  senna-docusate (SENOKOT-S) 8.6-50 MG tablet Take 1 tablet by mouth 2 (two) times daily. While taking strong pain meds to prevent constipation. Patient not taking: Reported on 09/17/2017 09/10/17   Sebastian Ache, MD  sulfamethoxazole-trimethoprim (BACTRIM DS,SEPTRA DS) 800-160 MG tablet Take 1 tablet by mouth 2 (two) times daily. Start the day prior to foley removal appointment Patient not taking: Reported on 09/17/2017 09/08/17   Harrie Foreman, PA-C    Family History Family History  Problem Relation Age of Onset  . Hypertension Mother   . Hypertension Father     Social History Social History   Tobacco Use  . Smoking status: Never Smoker  . Smokeless tobacco: Never Used  Substance Use Topics  . Alcohol use: No    Frequency: Never  . Drug use: Never     Allergies   Azithromycin; Codeine; and  Bing allergy]   Review of Systems Review of Systems  Constitutional: Negative for activity change, appetite change, chills and fever.  Gastrointestinal: Negative for diarrhea,  nausea and vomiting.  Endocrine: Negative for polydipsia and polyuria.  Genitourinary: Negative for discharge, flank pain, penile pain and penile swelling.       Dark urine  Musculoskeletal: Negative for back pain.  Neurological: Negative for light-headedness and headaches.  All other systems reviewed and are negative.    Physical Exam Updated Vital Signs BP 133/67   Pulse 71   Temp 98.2 F (36.8 C) (Oral)   Resp 15   SpO2 100%   Physical Exam  Constitutional:  He is oriented to person, place, and time. He appears well-developed and well-nourished. No distress.  HENT:  Head: Normocephalic and atraumatic.  Eyes: Conjunctivae are normal. Right eye exhibits no discharge. Left eye exhibits no discharge. No scleral icterus.  Neck: Normal range of motion.  Cardiovascular: Normal rate and regular rhythm.  Pulmonary/Chest: Effort normal. No stridor. No respiratory distress.  Abdominal: Soft. Bowel sounds are normal. He exhibits no distension. There is no tenderness.  No CVA tenderness to percussion.  Musculoskeletal: He exhibits no edema or deformity.  Neurological: He is alert and oriented to person, place, and time. He exhibits normal muscle tone.  Skin: Skin is warm and dry. He is not diaphoretic.  Psychiatric: He has a normal mood and affect. His behavior is normal.  Nursing note and vitals reviewed.    ED Treatments / Results  Labs (all labs ordered are listed, but only abnormal results are displayed) Labs Reviewed  URINALYSIS, ROUTINE W REFLEX MICROSCOPIC - Abnormal; Notable for the following components:      Result Value   Hgb urine dipstick LARGE (*)    Protein, ur 30 (*)    Leukocytes, UA SMALL (*)    Bacteria, UA RARE (*)    All other components within normal limits  COMPREHENSIVE METABOLIC PANEL - Abnormal; Notable for the following components:   Chloride 100 (*)    Glucose, Bld 112 (*)    BUN 21 (*)    AST 55 (*)    ALT 73 (*)    Total Bilirubin 1.4 (*)    All other components within normal limits  CBC WITH DIFFERENTIAL/PLATELET - Abnormal; Notable for the following components:   WBC 10.7 (*)    RBC 3.35 (*)    Hemoglobin 10.2 (*)    HCT 30.4 (*)    Platelets 430 (*)    Neutro Abs 7.9 (*)    Monocytes Absolute 1.1 (*)    All other components within normal limits  URINE CULTURE  CK    EKG None  Radiology No results found.  Procedures Procedures (including critical care time)  Medications Ordered in  ED Medications  sodium chloride 0.9 % bolus 1,000 mL (1,000 mLs Intravenous New Bag/Given 09/17/17 1959)     Initial Impression / Assessment and Plan / ED Course  I have reviewed the triage vital signs and the nursing notes.  Pertinent labs & imaging results that were available during my care of the patient were reviewed by me and considered in my medical decision making (see chart for details).    Patient is a very pleasant 62 year old man who presents today for evaluation of dark urine.  He is 9 days status post laparoscopic prostatectomy.  He has intermittent episodes of dark urine and is concerned that he may have an infection.  Urine obtained from catheter showing large blood, 30 protein, small leukocytes rare bacteria.  Nitrite negative.  Microscopic with 6-30 RBC and WBC.  Urine culture obtained.  Labs are obtained  showing normal creatinine, BUN 21, AST and ALT are both mildly elevated with mild elevation in total bilirubin.  Patient instructed to follow up with PCP on these.  Instructed to avoid alcohol and on safe tylenol dosing.  Patient given IV fluids while in the department.  Return precautions discussed, urology follow up.    Final Clinical Impressions(s) / ED Diagnoses   Final diagnoses:  Post-operative state  Dark urine    ED Discharge Orders    None       Norman Clay 09/17/17 2009    Charlynne Pander, MD 09/17/17 2350

## 2017-09-17 NOTE — ED Triage Notes (Signed)
Had prostectomy on 4/10 and still has foley. Notes that urine has gotten darker in color starting Wed. Been drinking lot of water and cleared out then got darker again last night and today. Due on Tuesday to get catheter removed.

## 2017-09-17 NOTE — Discharge Instructions (Signed)
I have given you general information on care after your surgery, if these instructions conflict with any the ear surgeon has given you then please follow the instructions from your surgeon.  Today your urine appears consistent with your postoperative state.  Your urine has been sent for a culture.  Your blood work was reviewed without cause for your symptoms today.  Some of your liver numbers were higher than expected.  Please make sure that you are not taking more than 3,000 mg of Tylenol a day.  Please follow-up with your primary care provider regarding your elevated liver enzymes.  If you develop fevers, flank pain, nausea vomiting diarrhea, or have any additional concerns please seek additional medical care and evaluation.

## 2017-09-19 LAB — URINE CULTURE

## 2017-09-20 ENCOUNTER — Telehealth: Payer: Self-pay | Admitting: Emergency Medicine

## 2017-09-20 NOTE — Telephone Encounter (Signed)
Post ED Visit - Positive Culture Follow-up: Successful Patient Follow-Up  Culture assessed and recommendations reviewed by: [x]  Enzo BiNathan Batchelder, Pharm.D. []  Celedonio MiyamotoJeremy Frens, Pharm.D., BCPS AQ-ID <[]  Garvin FilaMike Maccia, Pharm.D., BCPS []  Georgina PillionElizabeth Martin, Pharm.D., BCPS []  CanbyMinh Pham, [address] Rainbow BoulevardPharm.D., BCPS, AAHIVP []  Estella HuskMichelle Turner, Pharm.D., BCPS, AAHIVP []  Lysle Pearlachel Rumbarger, PharmD, BCPS []  Casilda Carlsaylor Stone, PharmD, BCPS []  Pollyann SamplesAndy Johnston, PharmD, BCPS  Positive urine culture  []  Patient discharged without antimicrobial prescription and treatment is now indicated []  Organism is resistant to prescribed ED discharge antimicrobial []  Patient with positive blood cultures  Changes discussed with ED provider: Graciella FreerLindsey Layden PA  Report faxed to Dr. Berneice HeinrichManny @ [phone]   Berle MullMiller, Daneshia Tavano 09/20/2017, 12:39 PM

## 2019-04-02 IMAGING — CR DG CHEST 2V
2 series · 2 of 2 positions shown · non-contrast
Comparison: None.

CLINICAL DATA: Pt c/o slightly productive cough, intermittent fever
x 1 wk.

EXAM:
CHEST  2 VIEW

[w chest pa]
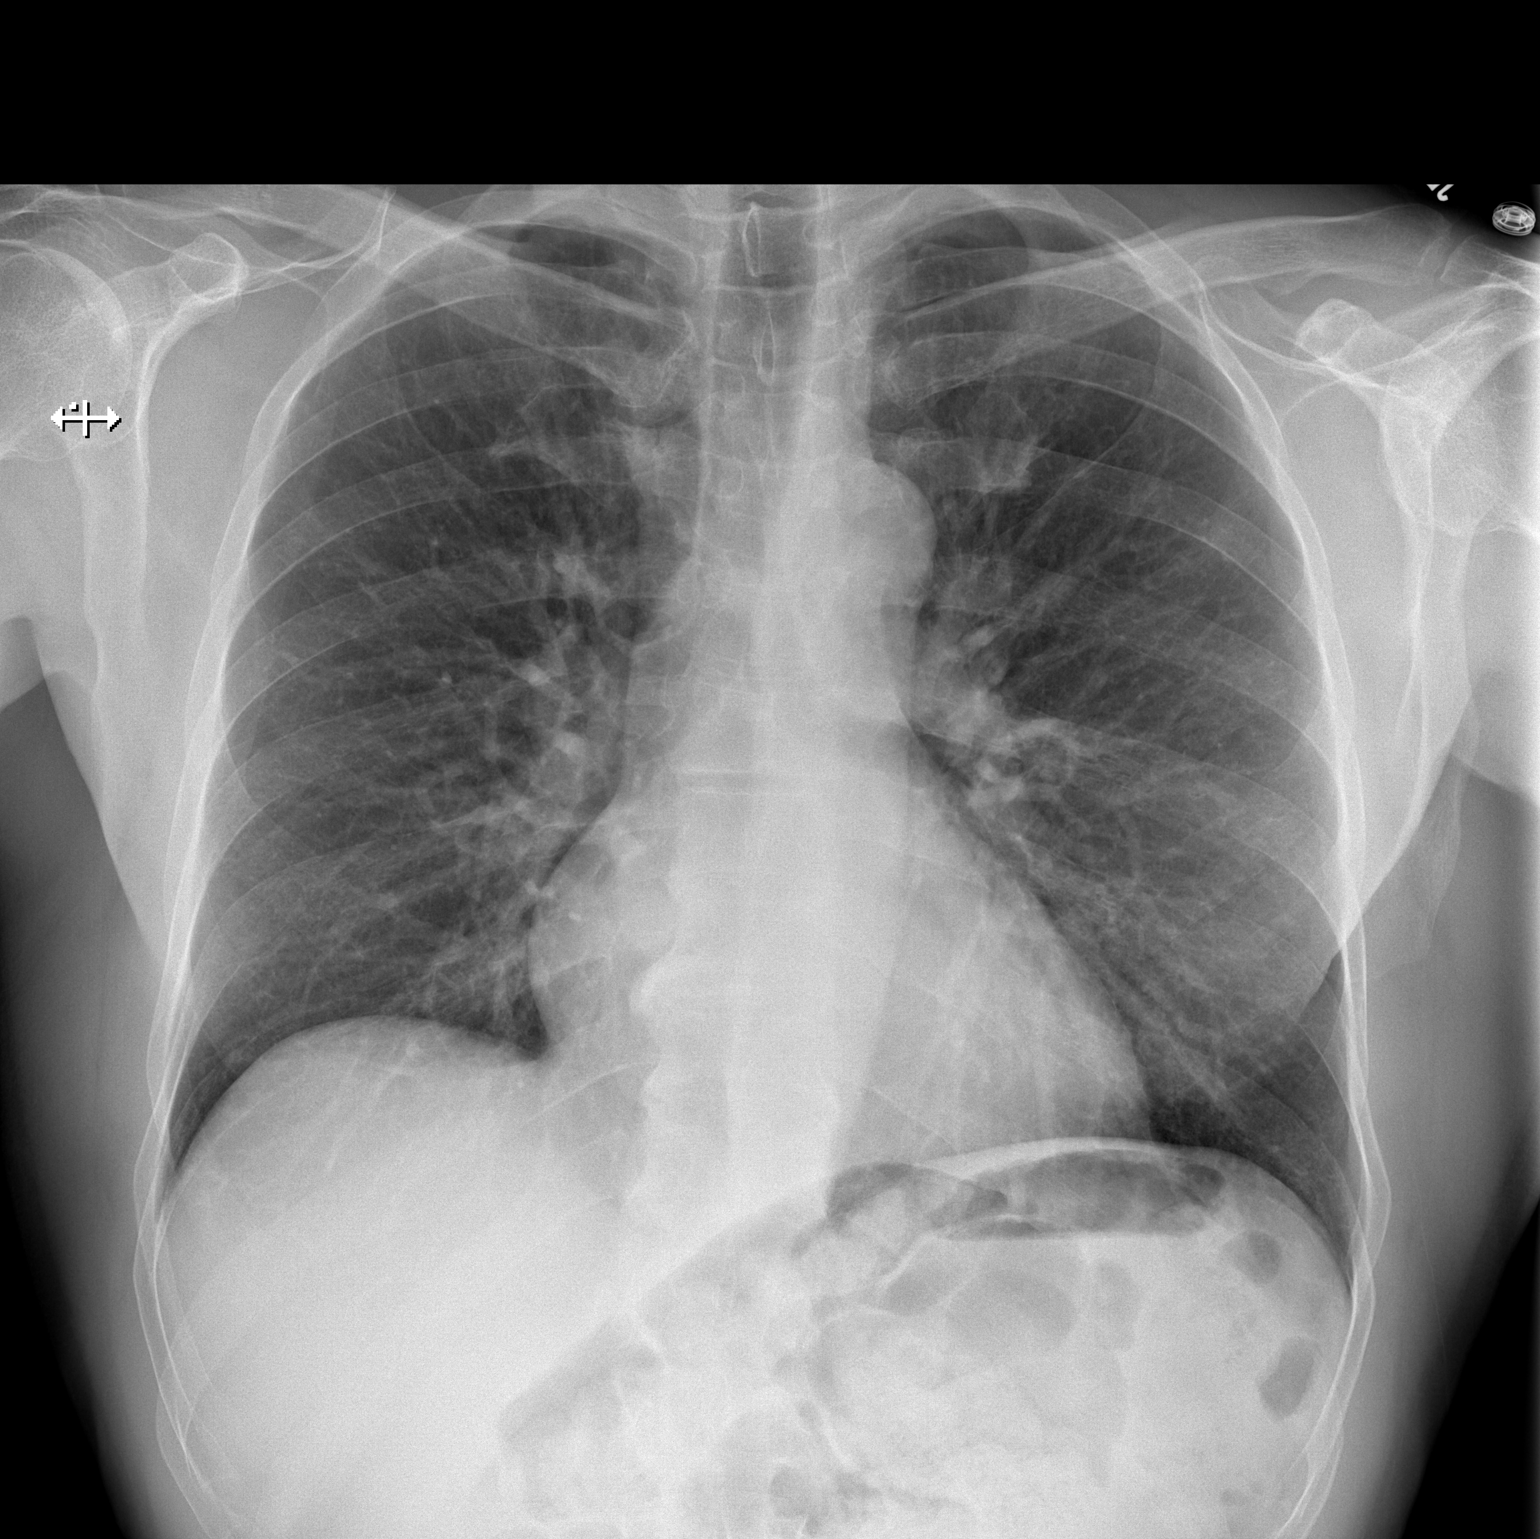

[w chest lat]
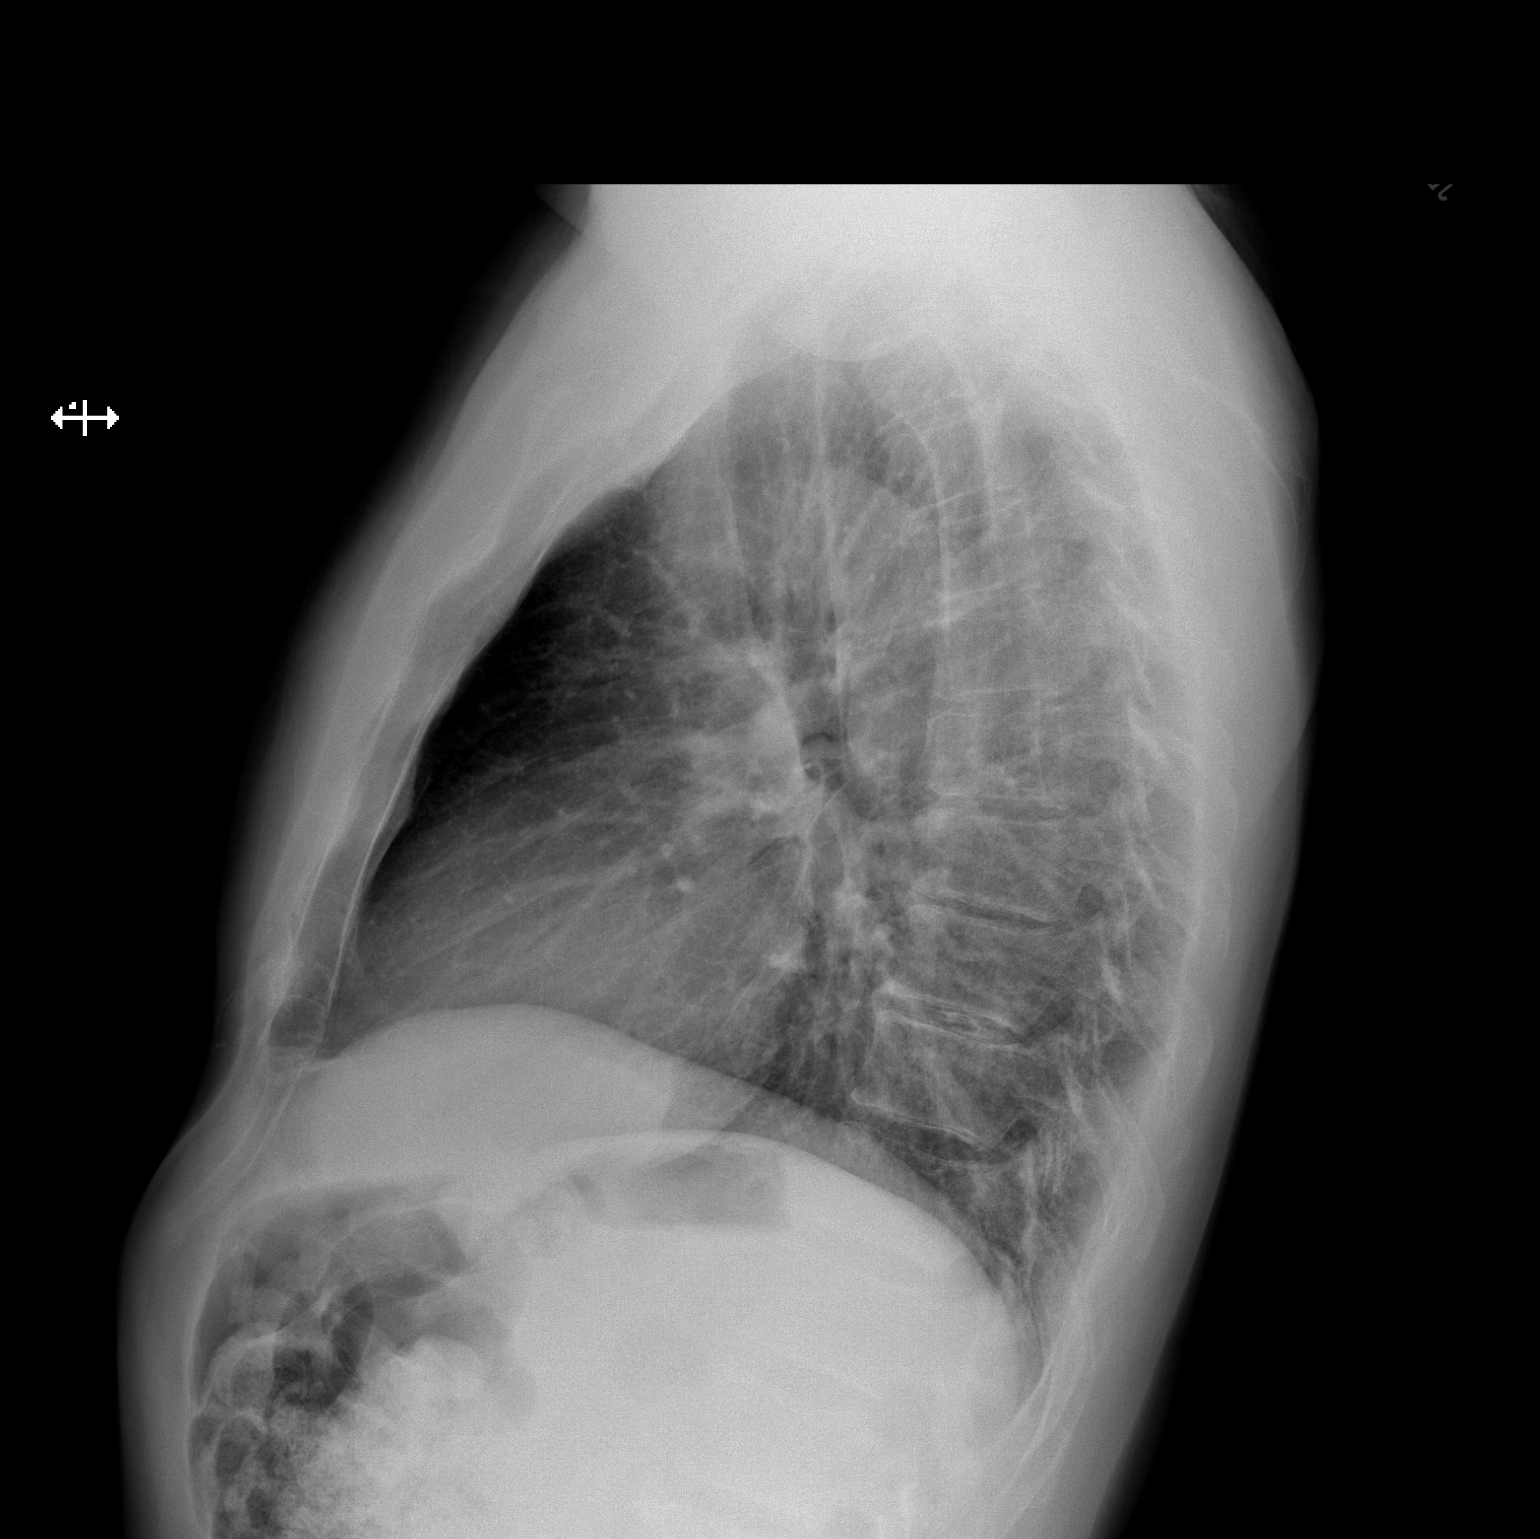

[2 of 2 positions shown; findings below may reference images not displayed]

FINDINGS: The heart size and mediastinal contours are within normal limits.
Both lungs are clear. No pleural effusion or pneumothorax. The
visualized skeletal structures are unremarkable.
IMPRESSION: No active cardiopulmonary disease.

## 2019-04-02 IMAGING — CT CT RENAL STONE PROTOCOL
2 of 4 series · 15 of 46 positions shown, 17 images · non-contrast
Comparison: None.

CLINICAL DATA: Pt reports cough, nausea, vomiting 1 week.ago. Sx
subsided , then resumed NVD last night. Last vomited clear liquid
this am. Last meal of sandwich and soup, last nightHematuria, no hx
of stones

EXAM:
CT ABDOMEN AND PELVIS WITHOUT CONTRAST
TECHNIQUE: Multidetector CT imaging of the abdomen and pelvis was performed
following the standard protocol without IV contrast.

[Series 2: axial st · axial · 0.72mm/px · z∈[+1071,+1481]mm · 12 of 94 slices shown, 14 images]
[im 6/94  soft-tissue]
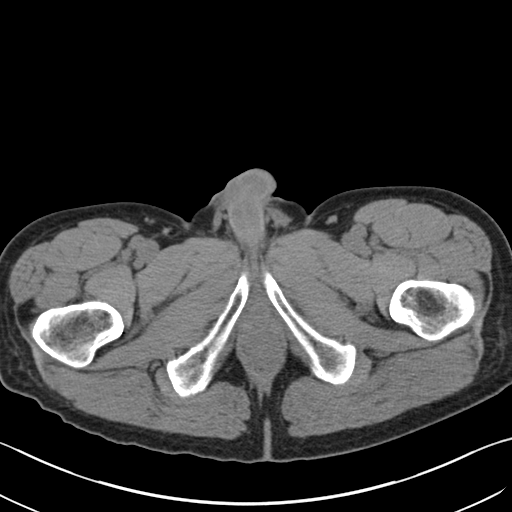
[im 6/94  bone]
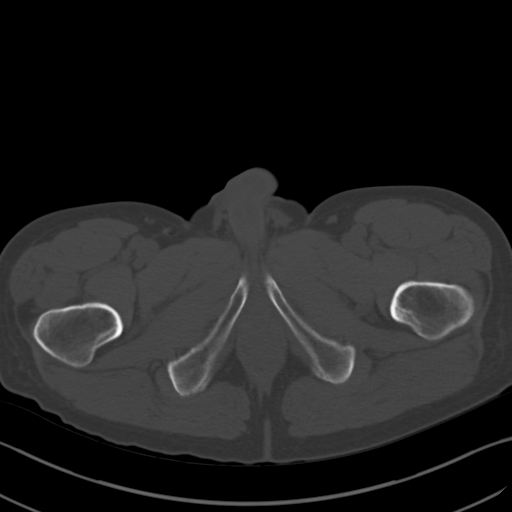
[im 12/94  soft-tissue]
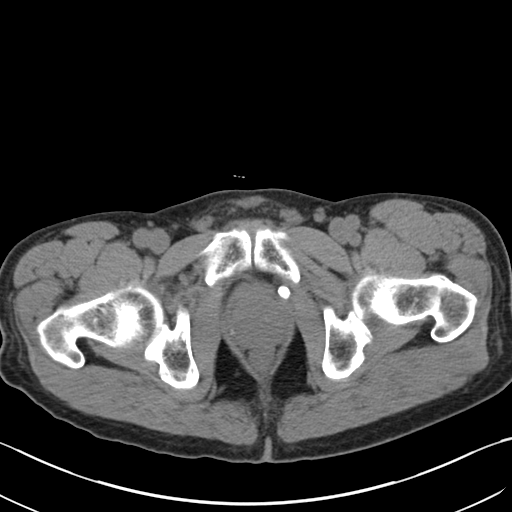
[im 24/94  soft-tissue]
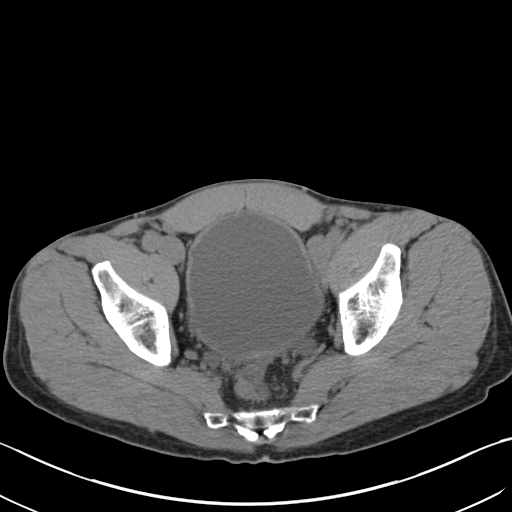
[im 30/94  soft-tissue]
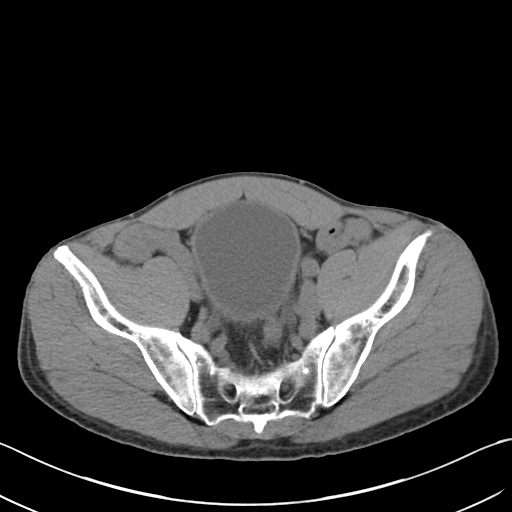
[im 35/94  soft-tissue]
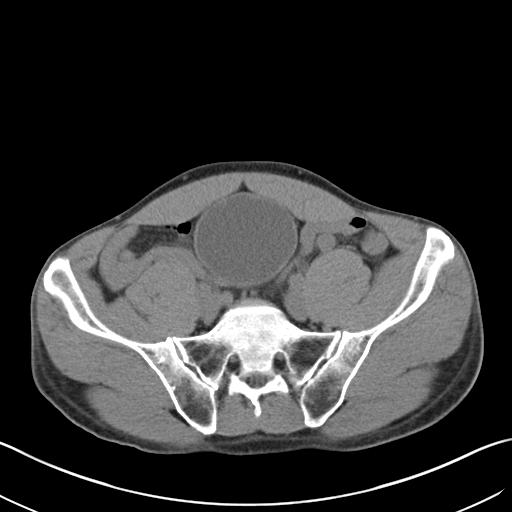
[im 41/94  soft-tissue]
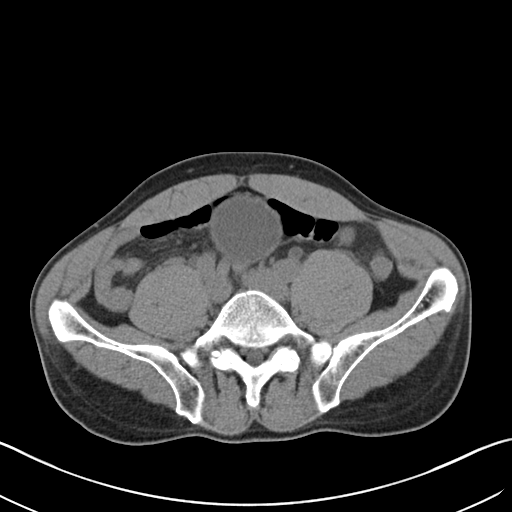
[im 53/94  soft-tissue]
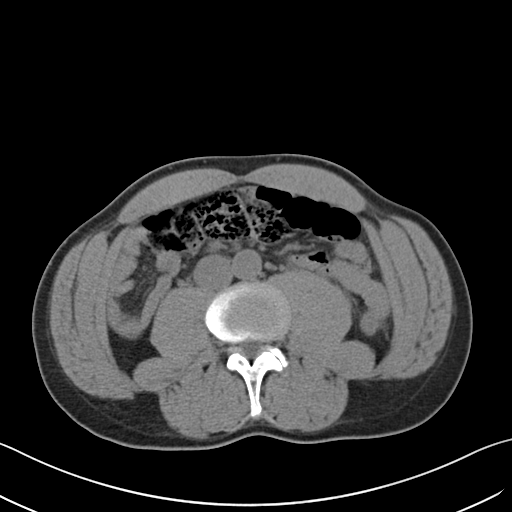
[im 59/94  soft-tissue]
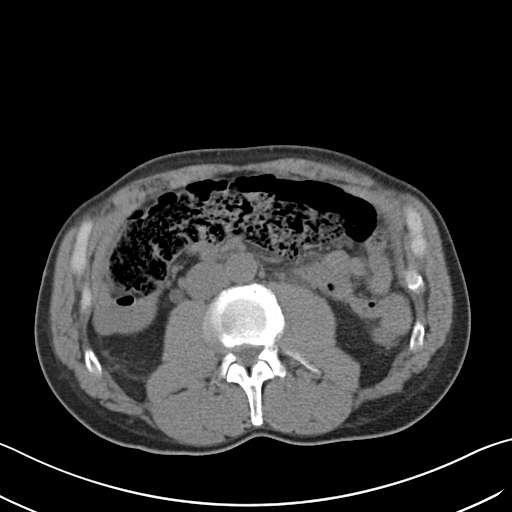
[im 64/94  soft-tissue]
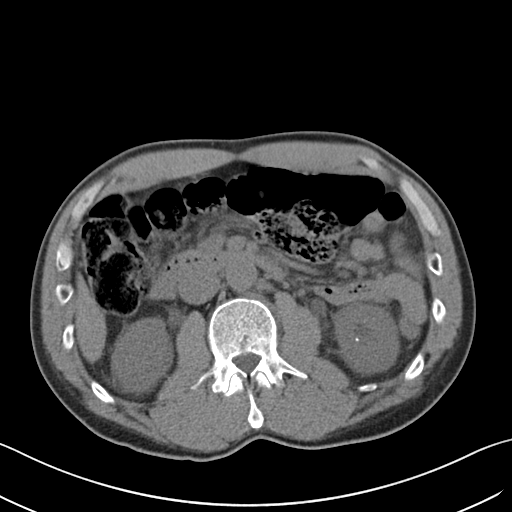
[im 64/94  bone]
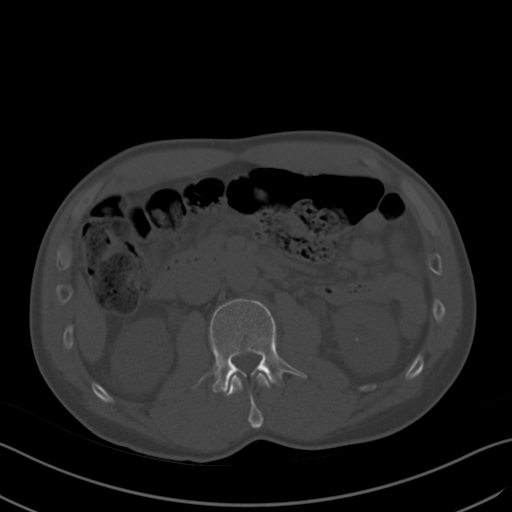
[im 70/94  soft-tissue]
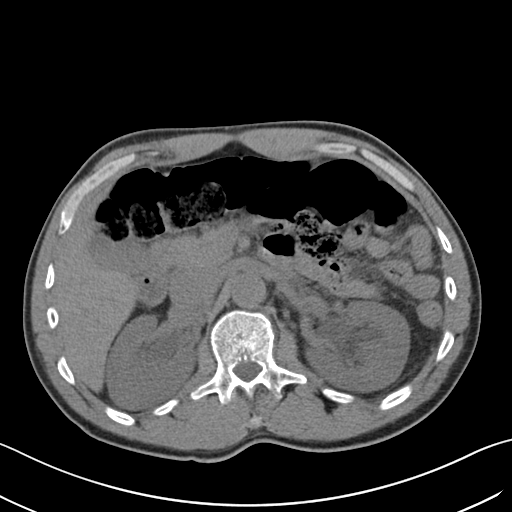
[im 82/94  soft-tissue]
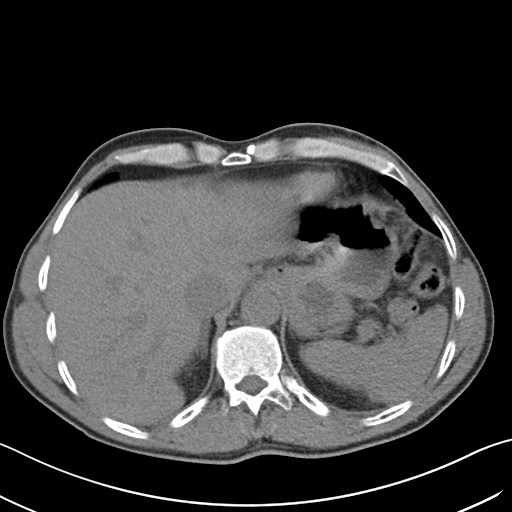
[im 88/94  soft-tissue]
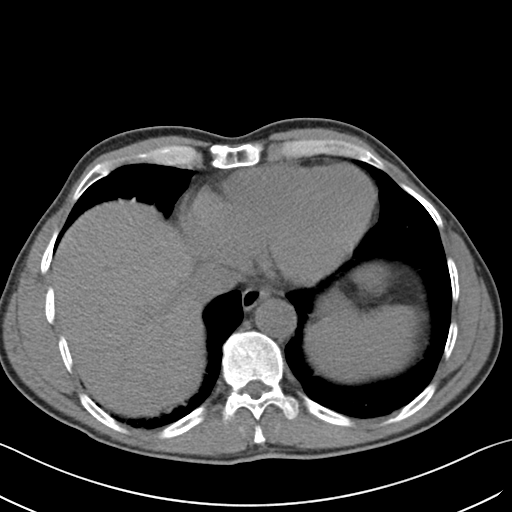

[Series 5: coronal · coronal · 0.70mm/px · 3 of 127 slices shown]
[im 43/127  soft-tissue]
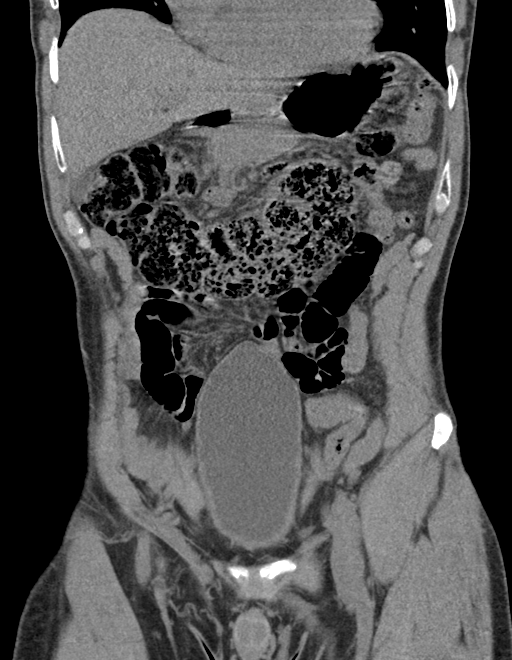
[im 57/127  soft-tissue]
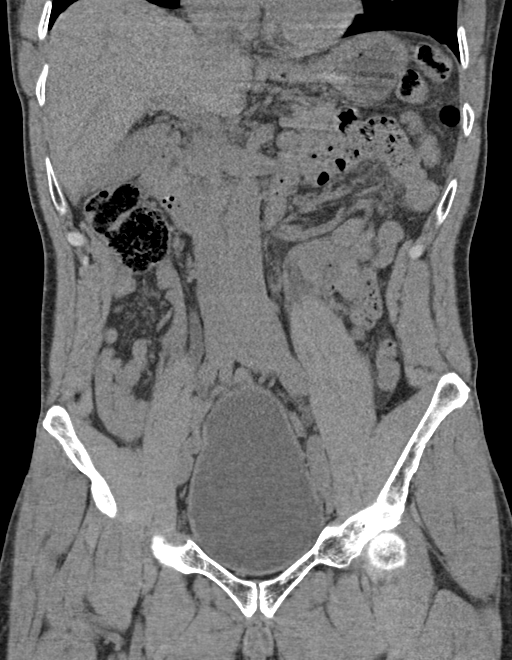
[im 71/127  soft-tissue]
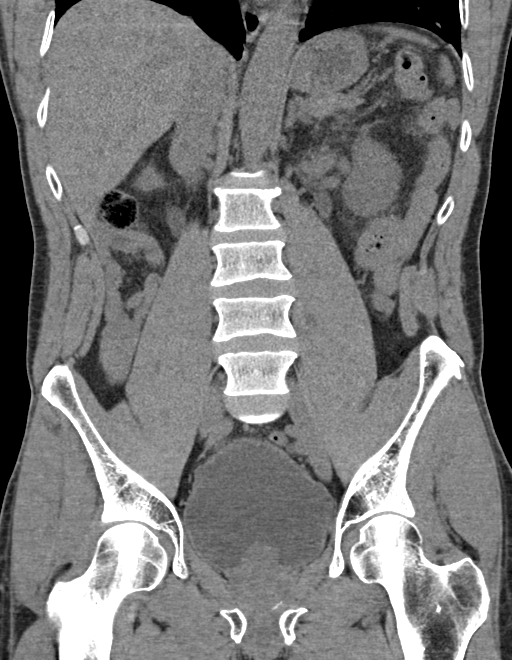

[15 of 46 positions shown; findings below may reference images not displayed]

FINDINGS: Lower chest: Mild reticular opacities in the dependent lower lobes
consistent with subsegmental atelectasis. Lung bases otherwise
clear. Heart normal in size.

Hepatobiliary: 7 mm low-density lesion in segment 7 consistent with
a cyst. No other liver masses or lesions. Normal gallbladder. No
bile duct dilation.

Pancreas: Unremarkable. No pancreatic ductal dilatation or
surrounding inflammatory changes.

Spleen: Normal in size without focal abnormality.

Adrenals/Urinary Tract: No adrenal masses.

Mild bilateral hydronephrosis and hydroureter is. There are 3 small
nonobstructing stones in the lower pole the left kidney. No other
intrarenal stones. No renal masses.

There are no ureteral stones.

The bladder is distended. No bladder wall thickening. No bladder
stones. Bladder is mildly elevated from an enlarged prostate.

Stomach/Bowel: Stomach and small bowel unremarkable. There is
increased stool in mild distention within the transverse and right
colon. No colonic wall thickening or adjacent inflammation. Right
colon extends across the midline to have the cecum lie in the left
mid to upper abdomen. Normal appendix visualized.

Vascular/Lymphatic: No significant vascular findings are present. No
enlarged abdominal or pelvic lymph nodes.

Reproductive: Prostate is enlarged measuring 6 x 5.2 x 5.2 cm. It
bulges into the bladder base.

Other: No abdominal wall hernia or abnormality. No abdominopelvic
ascites.

Musculoskeletal: No acute or significant osseous findings.
IMPRESSION: 1. Mild bilateral hydronephrosis and hydroureters, without a
ureteral stone. This appears due to bladder distention which is
likely on the basis of bladder outlet obstruction due to an enlarged
prostate.
2. Three small stones in the lower pole the left kidney.
3. Mild distention with increased stool noted in the right colon. No
bowel inflammation or obstruction.
